# Patient Record
Sex: Female | Born: 1950 | Smoking: Never smoker
Health system: Southern US, Community
[De-identification: ages and names within clinical notes are randomized; demographics above are authoritative.]

## PROBLEM LIST (undated history)

## (undated) DIAGNOSIS — I10 Essential (primary) hypertension: Secondary | ICD-10-CM

## (undated) DIAGNOSIS — G8929 Other chronic pain: Secondary | ICD-10-CM

## (undated) DIAGNOSIS — M67911 Unspecified disorder of synovium and tendon, right shoulder: Secondary | ICD-10-CM

## (undated) DIAGNOSIS — K219 Gastro-esophageal reflux disease without esophagitis: Secondary | ICD-10-CM

## (undated) DIAGNOSIS — G2581 Restless legs syndrome: Secondary | ICD-10-CM

## (undated) HISTORY — PX: TONSILLECTOMY: SUR1361

## (undated) HISTORY — PX: COLONOSCOPY: SHX174

## (undated) HISTORY — PX: BACK SURGERY: SHX140

## (undated) HISTORY — PX: OVARIAN CYST SURGERY: SHX726

---

## 1998-07-19 ENCOUNTER — Other Ambulatory Visit: Admission: RE | Admit: 1998-07-19 | Discharge: 1998-07-19 | Payer: Self-pay | Admitting: *Deleted

## 2002-07-06 ENCOUNTER — Ambulatory Visit (HOSPITAL_COMMUNITY): Admission: RE | Admit: 2002-07-06 | Discharge: 2002-07-06 | Payer: Self-pay | Admitting: *Deleted

## 2002-07-06 ENCOUNTER — Encounter (INDEPENDENT_AMBULATORY_CARE_PROVIDER_SITE_OTHER): Payer: Self-pay | Admitting: Specialist

## 2003-10-19 ENCOUNTER — Other Ambulatory Visit: Admission: RE | Admit: 2003-10-19 | Discharge: 2003-10-19 | Payer: Self-pay | Admitting: *Deleted

## 2006-01-30 ENCOUNTER — Other Ambulatory Visit: Admission: RE | Admit: 2006-01-30 | Discharge: 2006-01-30 | Payer: Self-pay | Admitting: *Deleted

## 2007-02-05 ENCOUNTER — Other Ambulatory Visit: Admission: RE | Admit: 2007-02-05 | Discharge: 2007-02-05 | Payer: Self-pay | Admitting: *Deleted

## 2010-07-12 ENCOUNTER — Other Ambulatory Visit: Payer: Self-pay | Admitting: Gynecology

## 2010-07-12 DIAGNOSIS — R928 Other abnormal and inconclusive findings on diagnostic imaging of breast: Secondary | ICD-10-CM

## 2010-07-17 ENCOUNTER — Other Ambulatory Visit: Payer: Self-pay

## 2010-07-17 ENCOUNTER — Ambulatory Visit
Admission: RE | Admit: 2010-07-17 | Discharge: 2010-07-17 | Disposition: A | Discharge status: Home / Self Care | Payer: Self-pay | Source: Ambulatory Visit | Attending: Gynecology | Admitting: Gynecology

## 2010-07-17 DIAGNOSIS — R928 Other abnormal and inconclusive findings on diagnostic imaging of breast: Secondary | ICD-10-CM

## 2010-09-29 NOTE — Op Note (Signed)
NAMELAEL, WETHERBEE                           ACCOUNT NO.:  [id number]   MEDICAL RECORD NO.:  [id number]                   PATIENT TYPE:  AMB   LOCATION:  DAY                                  FACILITY:  [facility]   PHYSICIAN:  Almedia Balls. Fore, M.D.                DATE OF BIRTH:  August 22, 1950   DATE OF PROCEDURE:  07/06/2002  DATE OF DISCHARGE:                                 OPERATIVE REPORT   PREOPERATIVE DIAGNOSES:  1. Pelvic pain.  2. Persisting adnexal cysts.   POSTOPERATIVE DIAGNOSES:  1. Pelvic pain.  2. Persisting adnexal cysts, pending pathology.   OPERATION:  1. Laparoscopy with excision of uterine lesions.  2. Destruction of the uterine lesions.   ANESTHESIA:  General orotracheal.   SURGEON:  Almedia Balls. Randell Patient, M.D.   INDICATIONS FOR PROCEDURE:  The patient is a 60 year old with pelvic pain,  uterine enlargement, persisting adnexal cystic areas, for a laparoscopy.  She has been fully counseled as to the nature of the procedure and the risks  involved, including the risks of anesthesia, injury to the uterus, tubes,  ovaries, bowel, bladder, blood vessels and ureters, postoperative  hemorrhage, infection and recuperation.  She fully understands all of these  considerations and wishes to proceed on July 06, 2002.   INDICATIONS FOR OUTPATIENT PROCEDURE:  This procedure is commonly performed  as an outpatient procedure.   FINDINGS:  On laparoscopy, the uterus was enlarged to approximately 8-[redacted]  weeks gestational size.  This also appeared to be soft.  The right ovary  appeared to be normal.  There was a corpus luteum present on the left ovary.  There were cystic lesions in the right adnexal area which extended down the  posterior surface of the uterus, over to the left adnexal area as well.  The  lower liver edge, gallbladder, spleen and appendix were normal to  visualization.   DESCRIPTION OF PROCEDURE:  With the patient under general anesthesia,  prepared and draped  in the usual sterile fashion, a tenaculum was placed on  the cervix with an acorn cannula.  The patient was then catheterized with  free flow of clear urine.  The patient was then prepared for a laparoscopic  procedure.  An incision was made in the lower pole of the umbilicus with  insertion of the Veress cannula and insufflation of 3 L of carbon dioxide.  The disposable 10 mm trocar for the operative scope, and the scope itself  was inserted into the peritoneal cavity.  The disposable 5 mm probe was  inserted through a stab wound just above the symphysis pubis.  The above-  noted findings were visualized.  The cystic lesions on the area of the  uterus were excised using sharp dissection.  Bipolar electrocoagulation was  used for hemostasis of this area, and for the destruction of other small  cystic lesions on the posterior serosal surface of  the uterus.  After noting  that hemostasis was maintained, following the reduction of intra-abdominal  pressure, by allowing gas to escape, and that the sponge and instrument  counts were correct, finally the instruments were removed from the  peritoneal cavity.  Gas was allowed to fully escape.  The incisions were  closed with fascial sutures of #0 Vicryl and subcuticular suture of #3-0  plain catgut.  The estimated blood loss was less than 25 mL.   The patient was taken to the recovery room in good condition following  catheterization and free flow of clear urine.   FOLLOW UP:  She is to return to the office in two weeks for followup.  She  was advised to use heat and Neosporin on the incisions.  She will be  discharged from the recovery room when her vital signs are stable, to be  fully ambulatory and on a regular diet.  She has been in good condition  throughout the procedure and will be discharged if she continues this way.  She was given a prescription for Darvocet-N 100 or generic, #15, to be taken  1/2 to 2 q.4h. p.r.n. pain, and Macrobid  #8, to be taken two stat and one  b.i.d.                                                Almedia Balls. Randell Patient, M.D.    SRF/MEDQ  D:  07/06/2002  T:  07/06/2002  Job:  [id number]

## 2011-10-17 ENCOUNTER — Other Ambulatory Visit: Payer: Self-pay | Admitting: Gynecology

## 2011-10-17 DIAGNOSIS — R928 Other abnormal and inconclusive findings on diagnostic imaging of breast: Secondary | ICD-10-CM

## 2011-10-24 ENCOUNTER — Ambulatory Visit
Admission: RE | Admit: 2011-10-24 | Discharge: 2011-10-24 | Disposition: A | Discharge status: Home / Self Care | Payer: Self-pay | Source: Ambulatory Visit | Attending: Gynecology | Admitting: Gynecology

## 2011-10-24 DIAGNOSIS — R928 Other abnormal and inconclusive findings on diagnostic imaging of breast: Secondary | ICD-10-CM

## 2014-11-09 ENCOUNTER — Other Ambulatory Visit: Payer: Self-pay | Admitting: Gynecology

## 2014-11-10 LAB — CYTOLOGY - PAP

## 2015-02-15 DIAGNOSIS — Z6832 Body mass index (BMI) 32.0-32.9, adult: Secondary | ICD-10-CM | POA: Diagnosis not present

## 2015-02-15 DIAGNOSIS — E785 Hyperlipidemia, unspecified: Secondary | ICD-10-CM | POA: Diagnosis not present

## 2015-02-15 DIAGNOSIS — Z9181 History of falling: Secondary | ICD-10-CM | POA: Diagnosis not present

## 2015-02-15 DIAGNOSIS — I10 Essential (primary) hypertension: Secondary | ICD-10-CM | POA: Diagnosis not present

## 2015-02-15 DIAGNOSIS — Z23 Encounter for immunization: Secondary | ICD-10-CM | POA: Diagnosis not present

## 2015-02-15 DIAGNOSIS — Z1389 Encounter for screening for other disorder: Secondary | ICD-10-CM | POA: Diagnosis not present

## 2015-03-09 DIAGNOSIS — Z0000 Encounter for general adult medical examination without abnormal findings: Secondary | ICD-10-CM | POA: Diagnosis not present

## 2015-03-09 DIAGNOSIS — Z6832 Body mass index (BMI) 32.0-32.9, adult: Secondary | ICD-10-CM | POA: Diagnosis not present

## 2015-03-09 DIAGNOSIS — R5381 Other malaise: Secondary | ICD-10-CM | POA: Diagnosis not present

## 2015-03-09 DIAGNOSIS — Z23 Encounter for immunization: Secondary | ICD-10-CM | POA: Diagnosis not present

## 2015-03-09 DIAGNOSIS — E785 Hyperlipidemia, unspecified: Secondary | ICD-10-CM | POA: Diagnosis not present

## 2015-03-21 DIAGNOSIS — M8588 Other specified disorders of bone density and structure, other site: Secondary | ICD-10-CM | POA: Diagnosis not present

## 2015-03-21 DIAGNOSIS — N958 Other specified menopausal and perimenopausal disorders: Secondary | ICD-10-CM | POA: Diagnosis not present

## 2015-04-11 DIAGNOSIS — I10 Essential (primary) hypertension: Secondary | ICD-10-CM | POA: Diagnosis not present

## 2015-04-11 DIAGNOSIS — Z6832 Body mass index (BMI) 32.0-32.9, adult: Secondary | ICD-10-CM | POA: Diagnosis not present

## 2015-04-11 DIAGNOSIS — E663 Overweight: Secondary | ICD-10-CM | POA: Diagnosis not present

## 2015-04-18 DIAGNOSIS — Z1211 Encounter for screening for malignant neoplasm of colon: Secondary | ICD-10-CM | POA: Diagnosis not present

## 2015-05-17 DIAGNOSIS — M5412 Radiculopathy, cervical region: Secondary | ICD-10-CM | POA: Diagnosis not present

## 2015-05-17 DIAGNOSIS — M542 Cervicalgia: Secondary | ICD-10-CM | POA: Diagnosis not present

## 2015-05-17 DIAGNOSIS — Z6831 Body mass index (BMI) 31.0-31.9, adult: Secondary | ICD-10-CM | POA: Diagnosis not present

## 2015-05-18 DIAGNOSIS — M4802 Spinal stenosis, cervical region: Secondary | ICD-10-CM | POA: Diagnosis not present

## 2015-05-18 DIAGNOSIS — M79601 Pain in right arm: Secondary | ICD-10-CM | POA: Diagnosis not present

## 2015-05-18 DIAGNOSIS — M50323 Other cervical disc degeneration at C6-C7 level: Secondary | ICD-10-CM | POA: Diagnosis not present

## 2015-05-18 DIAGNOSIS — M47892 Other spondylosis, cervical region: Secondary | ICD-10-CM | POA: Diagnosis not present

## 2015-05-18 DIAGNOSIS — M79602 Pain in left arm: Secondary | ICD-10-CM | POA: Diagnosis not present

## 2015-05-18 DIAGNOSIS — M5030 Other cervical disc degeneration, unspecified cervical region: Secondary | ICD-10-CM | POA: Diagnosis not present

## 2015-05-25 DIAGNOSIS — M542 Cervicalgia: Secondary | ICD-10-CM | POA: Diagnosis not present

## 2015-05-30 DIAGNOSIS — M542 Cervicalgia: Secondary | ICD-10-CM | POA: Diagnosis not present

## 2015-06-01 DIAGNOSIS — M542 Cervicalgia: Secondary | ICD-10-CM | POA: Diagnosis not present

## 2015-06-02 DIAGNOSIS — M542 Cervicalgia: Secondary | ICD-10-CM | POA: Diagnosis not present

## 2015-06-07 DIAGNOSIS — M542 Cervicalgia: Secondary | ICD-10-CM | POA: Diagnosis not present

## 2015-06-14 DIAGNOSIS — M542 Cervicalgia: Secondary | ICD-10-CM | POA: Diagnosis not present

## 2015-06-16 DIAGNOSIS — M542 Cervicalgia: Secondary | ICD-10-CM | POA: Diagnosis not present

## 2015-07-14 DIAGNOSIS — M8580 Other specified disorders of bone density and structure, unspecified site: Secondary | ICD-10-CM | POA: Diagnosis not present

## 2015-07-14 DIAGNOSIS — I10 Essential (primary) hypertension: Secondary | ICD-10-CM | POA: Diagnosis not present

## 2015-09-19 DIAGNOSIS — Z6830 Body mass index (BMI) 30.0-30.9, adult: Secondary | ICD-10-CM | POA: Diagnosis not present

## 2015-09-19 DIAGNOSIS — R1013 Epigastric pain: Secondary | ICD-10-CM | POA: Diagnosis not present

## 2015-10-17 DIAGNOSIS — I10 Essential (primary) hypertension: Secondary | ICD-10-CM | POA: Diagnosis not present

## 2015-10-17 DIAGNOSIS — E785 Hyperlipidemia, unspecified: Secondary | ICD-10-CM | POA: Diagnosis not present

## 2015-10-17 DIAGNOSIS — E559 Vitamin D deficiency, unspecified: Secondary | ICD-10-CM | POA: Diagnosis not present

## 2015-10-17 DIAGNOSIS — M1990 Unspecified osteoarthritis, unspecified site: Secondary | ICD-10-CM | POA: Diagnosis not present

## 2015-10-17 DIAGNOSIS — M79641 Pain in right hand: Secondary | ICD-10-CM | POA: Diagnosis not present

## 2015-10-17 DIAGNOSIS — Z6830 Body mass index (BMI) 30.0-30.9, adult: Secondary | ICD-10-CM | POA: Diagnosis not present

## 2015-10-17 DIAGNOSIS — Z1231 Encounter for screening mammogram for malignant neoplasm of breast: Secondary | ICD-10-CM | POA: Diagnosis not present

## 2015-10-27 DIAGNOSIS — Z1231 Encounter for screening mammogram for malignant neoplasm of breast: Secondary | ICD-10-CM | POA: Diagnosis not present

## 2015-12-01 DIAGNOSIS — M5412 Radiculopathy, cervical region: Secondary | ICD-10-CM | POA: Diagnosis not present

## 2015-12-01 DIAGNOSIS — Z6829 Body mass index (BMI) 29.0-29.9, adult: Secondary | ICD-10-CM | POA: Diagnosis not present

## 2015-12-07 DIAGNOSIS — M5412 Radiculopathy, cervical region: Secondary | ICD-10-CM | POA: Diagnosis not present

## 2015-12-07 DIAGNOSIS — M50221 Other cervical disc displacement at C4-C5 level: Secondary | ICD-10-CM | POA: Diagnosis not present

## 2015-12-07 DIAGNOSIS — M50323 Other cervical disc degeneration at C6-C7 level: Secondary | ICD-10-CM | POA: Diagnosis not present

## 2015-12-07 DIAGNOSIS — M50322 Other cervical disc degeneration at C5-C6 level: Secondary | ICD-10-CM | POA: Diagnosis not present

## 2015-12-26 DIAGNOSIS — M50222 Other cervical disc displacement at C5-C6 level: Secondary | ICD-10-CM | POA: Diagnosis not present

## 2015-12-26 DIAGNOSIS — M542 Cervicalgia: Secondary | ICD-10-CM | POA: Diagnosis not present

## 2015-12-26 DIAGNOSIS — M4802 Spinal stenosis, cervical region: Secondary | ICD-10-CM | POA: Diagnosis not present

## 2016-01-19 DIAGNOSIS — E785 Hyperlipidemia, unspecified: Secondary | ICD-10-CM | POA: Diagnosis not present

## 2016-01-19 DIAGNOSIS — M5412 Radiculopathy, cervical region: Secondary | ICD-10-CM | POA: Diagnosis not present

## 2016-01-19 DIAGNOSIS — M1990 Unspecified osteoarthritis, unspecified site: Secondary | ICD-10-CM | POA: Diagnosis not present

## 2016-01-19 DIAGNOSIS — R1013 Epigastric pain: Secondary | ICD-10-CM | POA: Diagnosis not present

## 2016-01-19 DIAGNOSIS — I10 Essential (primary) hypertension: Secondary | ICD-10-CM | POA: Diagnosis not present

## 2016-02-06 DIAGNOSIS — M549 Dorsalgia, unspecified: Secondary | ICD-10-CM | POA: Diagnosis not present

## 2016-02-13 DIAGNOSIS — H1013 Acute atopic conjunctivitis, bilateral: Secondary | ICD-10-CM | POA: Diagnosis not present

## 2016-02-20 DIAGNOSIS — R269 Unspecified abnormalities of gait and mobility: Secondary | ICD-10-CM | POA: Diagnosis not present

## 2016-02-20 DIAGNOSIS — M545 Low back pain: Secondary | ICD-10-CM | POA: Diagnosis not present

## 2016-02-20 DIAGNOSIS — M79604 Pain in right leg: Secondary | ICD-10-CM | POA: Diagnosis not present

## 2016-02-20 DIAGNOSIS — M2560 Stiffness of unspecified joint, not elsewhere classified: Secondary | ICD-10-CM | POA: Diagnosis not present

## 2016-02-23 DIAGNOSIS — M79604 Pain in right leg: Secondary | ICD-10-CM | POA: Diagnosis not present

## 2016-02-23 DIAGNOSIS — M545 Low back pain: Secondary | ICD-10-CM | POA: Diagnosis not present

## 2016-02-23 DIAGNOSIS — M2560 Stiffness of unspecified joint, not elsewhere classified: Secondary | ICD-10-CM | POA: Diagnosis not present

## 2016-02-23 DIAGNOSIS — R269 Unspecified abnormalities of gait and mobility: Secondary | ICD-10-CM | POA: Diagnosis not present

## 2016-02-28 DIAGNOSIS — M79604 Pain in right leg: Secondary | ICD-10-CM | POA: Diagnosis not present

## 2016-02-28 DIAGNOSIS — R269 Unspecified abnormalities of gait and mobility: Secondary | ICD-10-CM | POA: Diagnosis not present

## 2016-02-28 DIAGNOSIS — M2560 Stiffness of unspecified joint, not elsewhere classified: Secondary | ICD-10-CM | POA: Diagnosis not present

## 2016-02-28 DIAGNOSIS — M545 Low back pain: Secondary | ICD-10-CM | POA: Diagnosis not present

## 2016-03-01 DIAGNOSIS — R269 Unspecified abnormalities of gait and mobility: Secondary | ICD-10-CM | POA: Diagnosis not present

## 2016-03-01 DIAGNOSIS — M79604 Pain in right leg: Secondary | ICD-10-CM | POA: Diagnosis not present

## 2016-03-01 DIAGNOSIS — M545 Low back pain: Secondary | ICD-10-CM | POA: Diagnosis not present

## 2016-03-01 DIAGNOSIS — M2560 Stiffness of unspecified joint, not elsewhere classified: Secondary | ICD-10-CM | POA: Diagnosis not present

## 2016-03-06 DIAGNOSIS — M79604 Pain in right leg: Secondary | ICD-10-CM | POA: Diagnosis not present

## 2016-03-06 DIAGNOSIS — M545 Low back pain: Secondary | ICD-10-CM | POA: Diagnosis not present

## 2016-03-06 DIAGNOSIS — R269 Unspecified abnormalities of gait and mobility: Secondary | ICD-10-CM | POA: Diagnosis not present

## 2016-03-06 DIAGNOSIS — M2560 Stiffness of unspecified joint, not elsewhere classified: Secondary | ICD-10-CM | POA: Diagnosis not present

## 2016-03-13 DIAGNOSIS — M79604 Pain in right leg: Secondary | ICD-10-CM | POA: Diagnosis not present

## 2016-03-13 DIAGNOSIS — M2560 Stiffness of unspecified joint, not elsewhere classified: Secondary | ICD-10-CM | POA: Diagnosis not present

## 2016-03-13 DIAGNOSIS — R269 Unspecified abnormalities of gait and mobility: Secondary | ICD-10-CM | POA: Diagnosis not present

## 2016-03-13 DIAGNOSIS — M545 Low back pain: Secondary | ICD-10-CM | POA: Diagnosis not present

## 2016-03-15 DIAGNOSIS — R269 Unspecified abnormalities of gait and mobility: Secondary | ICD-10-CM | POA: Diagnosis not present

## 2016-03-15 DIAGNOSIS — M79604 Pain in right leg: Secondary | ICD-10-CM | POA: Diagnosis not present

## 2016-03-15 DIAGNOSIS — M545 Low back pain: Secondary | ICD-10-CM | POA: Diagnosis not present

## 2016-03-15 DIAGNOSIS — M2560 Stiffness of unspecified joint, not elsewhere classified: Secondary | ICD-10-CM | POA: Diagnosis not present

## 2016-03-21 DIAGNOSIS — L821 Other seborrheic keratosis: Secondary | ICD-10-CM | POA: Diagnosis not present

## 2016-03-21 DIAGNOSIS — L219 Seborrheic dermatitis, unspecified: Secondary | ICD-10-CM | POA: Diagnosis not present

## 2016-03-21 DIAGNOSIS — L578 Other skin changes due to chronic exposure to nonionizing radiation: Secondary | ICD-10-CM | POA: Diagnosis not present

## 2016-04-03 DIAGNOSIS — M50222 Other cervical disc displacement at C5-C6 level: Secondary | ICD-10-CM | POA: Diagnosis not present

## 2016-04-19 DIAGNOSIS — E785 Hyperlipidemia, unspecified: Secondary | ICD-10-CM | POA: Diagnosis not present

## 2016-04-19 DIAGNOSIS — Z23 Encounter for immunization: Secondary | ICD-10-CM | POA: Diagnosis not present

## 2016-04-19 DIAGNOSIS — M5412 Radiculopathy, cervical region: Secondary | ICD-10-CM | POA: Diagnosis not present

## 2016-04-19 DIAGNOSIS — Z1389 Encounter for screening for other disorder: Secondary | ICD-10-CM | POA: Diagnosis not present

## 2016-04-19 DIAGNOSIS — I10 Essential (primary) hypertension: Secondary | ICD-10-CM | POA: Diagnosis not present

## 2016-04-19 DIAGNOSIS — M1990 Unspecified osteoarthritis, unspecified site: Secondary | ICD-10-CM | POA: Diagnosis not present

## 2016-04-19 DIAGNOSIS — Z9181 History of falling: Secondary | ICD-10-CM | POA: Diagnosis not present

## 2016-05-01 DIAGNOSIS — M50222 Other cervical disc displacement at C5-C6 level: Secondary | ICD-10-CM | POA: Diagnosis not present

## 2016-05-01 DIAGNOSIS — M4802 Spinal stenosis, cervical region: Secondary | ICD-10-CM | POA: Diagnosis not present

## 2016-05-02 DIAGNOSIS — L821 Other seborrheic keratosis: Secondary | ICD-10-CM | POA: Diagnosis not present

## 2016-05-02 DIAGNOSIS — L219 Seborrheic dermatitis, unspecified: Secondary | ICD-10-CM | POA: Diagnosis not present

## 2016-05-14 DIAGNOSIS — M5430 Sciatica, unspecified side: Secondary | ICD-10-CM | POA: Insufficient documentation

## 2016-05-17 DIAGNOSIS — J208 Acute bronchitis due to other specified organisms: Secondary | ICD-10-CM | POA: Diagnosis not present

## 2016-05-17 DIAGNOSIS — R509 Fever, unspecified: Secondary | ICD-10-CM | POA: Diagnosis not present

## 2016-05-29 DIAGNOSIS — M4802 Spinal stenosis, cervical region: Secondary | ICD-10-CM | POA: Diagnosis not present

## 2016-06-04 DIAGNOSIS — M545 Low back pain: Secondary | ICD-10-CM | POA: Diagnosis not present

## 2016-06-04 DIAGNOSIS — M5431 Sciatica, right side: Secondary | ICD-10-CM | POA: Diagnosis not present

## 2016-06-04 DIAGNOSIS — M5136 Other intervertebral disc degeneration, lumbar region: Secondary | ICD-10-CM | POA: Diagnosis not present

## 2016-06-11 DIAGNOSIS — M5126 Other intervertebral disc displacement, lumbar region: Secondary | ICD-10-CM | POA: Diagnosis not present

## 2016-06-11 DIAGNOSIS — M48061 Spinal stenosis, lumbar region without neurogenic claudication: Secondary | ICD-10-CM | POA: Diagnosis not present

## 2016-06-11 DIAGNOSIS — M545 Low back pain: Secondary | ICD-10-CM | POA: Diagnosis not present

## 2016-06-11 DIAGNOSIS — M5431 Sciatica, right side: Secondary | ICD-10-CM | POA: Diagnosis not present

## 2016-07-03 DIAGNOSIS — Z1389 Encounter for screening for other disorder: Secondary | ICD-10-CM | POA: Diagnosis not present

## 2016-07-03 DIAGNOSIS — Z136 Encounter for screening for cardiovascular disorders: Secondary | ICD-10-CM | POA: Diagnosis not present

## 2016-07-03 DIAGNOSIS — Z23 Encounter for immunization: Secondary | ICD-10-CM | POA: Diagnosis not present

## 2016-07-03 DIAGNOSIS — Z9181 History of falling: Secondary | ICD-10-CM | POA: Diagnosis not present

## 2016-07-03 DIAGNOSIS — Z0000 Encounter for general adult medical examination without abnormal findings: Secondary | ICD-10-CM | POA: Diagnosis not present

## 2016-07-03 DIAGNOSIS — N959 Unspecified menopausal and perimenopausal disorder: Secondary | ICD-10-CM | POA: Diagnosis not present

## 2016-07-03 DIAGNOSIS — Z1231 Encounter for screening mammogram for malignant neoplasm of breast: Secondary | ICD-10-CM | POA: Diagnosis not present

## 2016-07-18 DIAGNOSIS — M1990 Unspecified osteoarthritis, unspecified site: Secondary | ICD-10-CM | POA: Diagnosis not present

## 2016-07-18 DIAGNOSIS — I10 Essential (primary) hypertension: Secondary | ICD-10-CM | POA: Diagnosis not present

## 2016-07-18 DIAGNOSIS — E785 Hyperlipidemia, unspecified: Secondary | ICD-10-CM | POA: Diagnosis not present

## 2016-07-18 DIAGNOSIS — E559 Vitamin D deficiency, unspecified: Secondary | ICD-10-CM | POA: Diagnosis not present

## 2016-07-18 DIAGNOSIS — R1013 Epigastric pain: Secondary | ICD-10-CM | POA: Diagnosis not present

## 2016-07-26 DIAGNOSIS — M5431 Sciatica, right side: Secondary | ICD-10-CM | POA: Diagnosis not present

## 2016-07-26 DIAGNOSIS — M48062 Spinal stenosis, lumbar region with neurogenic claudication: Secondary | ICD-10-CM | POA: Diagnosis not present

## 2016-07-26 DIAGNOSIS — M549 Dorsalgia, unspecified: Secondary | ICD-10-CM | POA: Diagnosis not present

## 2016-07-26 DIAGNOSIS — M5136 Other intervertebral disc degeneration, lumbar region: Secondary | ICD-10-CM | POA: Diagnosis not present

## 2016-07-26 DIAGNOSIS — M545 Low back pain: Secondary | ICD-10-CM | POA: Diagnosis not present

## 2016-08-17 DIAGNOSIS — Z4689 Encounter for fitting and adjustment of other specified devices: Secondary | ICD-10-CM | POA: Diagnosis not present

## 2016-08-17 DIAGNOSIS — M5431 Sciatica, right side: Secondary | ICD-10-CM | POA: Diagnosis not present

## 2016-08-17 DIAGNOSIS — M48062 Spinal stenosis, lumbar region with neurogenic claudication: Secondary | ICD-10-CM | POA: Diagnosis not present

## 2016-08-17 DIAGNOSIS — Z01812 Encounter for preprocedural laboratory examination: Secondary | ICD-10-CM | POA: Diagnosis not present

## 2016-08-17 DIAGNOSIS — M5106 Intervertebral disc disorders with myelopathy, lumbar region: Secondary | ICD-10-CM | POA: Diagnosis not present

## 2016-08-17 DIAGNOSIS — Z5189 Encounter for other specified aftercare: Secondary | ICD-10-CM | POA: Diagnosis not present

## 2016-08-17 DIAGNOSIS — M545 Low back pain: Secondary | ICD-10-CM | POA: Diagnosis not present

## 2016-08-21 DIAGNOSIS — M5106 Intervertebral disc disorders with myelopathy, lumbar region: Secondary | ICD-10-CM | POA: Diagnosis not present

## 2016-08-21 DIAGNOSIS — Z9889 Other specified postprocedural states: Secondary | ICD-10-CM | POA: Diagnosis not present

## 2016-08-21 DIAGNOSIS — M1990 Unspecified osteoarthritis, unspecified site: Secondary | ICD-10-CM | POA: Diagnosis not present

## 2016-08-21 DIAGNOSIS — Z79899 Other long term (current) drug therapy: Secondary | ICD-10-CM | POA: Diagnosis not present

## 2016-08-21 DIAGNOSIS — Z981 Arthrodesis status: Secondary | ICD-10-CM | POA: Diagnosis not present

## 2016-08-21 DIAGNOSIS — R2689 Other abnormalities of gait and mobility: Secondary | ICD-10-CM | POA: Diagnosis not present

## 2016-08-21 DIAGNOSIS — M48061 Spinal stenosis, lumbar region without neurogenic claudication: Secondary | ICD-10-CM | POA: Diagnosis not present

## 2016-08-21 DIAGNOSIS — M47816 Spondylosis without myelopathy or radiculopathy, lumbar region: Secondary | ICD-10-CM | POA: Diagnosis not present

## 2016-08-21 DIAGNOSIS — Z7982 Long term (current) use of aspirin: Secondary | ICD-10-CM | POA: Diagnosis not present

## 2016-08-21 DIAGNOSIS — M48062 Spinal stenosis, lumbar region with neurogenic claudication: Secondary | ICD-10-CM | POA: Diagnosis not present

## 2016-08-21 DIAGNOSIS — M5116 Intervertebral disc disorders with radiculopathy, lumbar region: Secondary | ICD-10-CM | POA: Diagnosis not present

## 2016-08-21 DIAGNOSIS — I10 Essential (primary) hypertension: Secondary | ICD-10-CM | POA: Diagnosis not present

## 2016-08-21 DIAGNOSIS — M5431 Sciatica, right side: Secondary | ICD-10-CM | POA: Diagnosis not present

## 2016-08-22 DIAGNOSIS — M5116 Intervertebral disc disorders with radiculopathy, lumbar region: Secondary | ICD-10-CM | POA: Diagnosis not present

## 2016-08-22 DIAGNOSIS — I10 Essential (primary) hypertension: Secondary | ICD-10-CM | POA: Diagnosis not present

## 2016-08-22 DIAGNOSIS — Z981 Arthrodesis status: Secondary | ICD-10-CM | POA: Diagnosis not present

## 2016-08-22 DIAGNOSIS — R2689 Other abnormalities of gait and mobility: Secondary | ICD-10-CM | POA: Diagnosis not present

## 2016-08-22 DIAGNOSIS — M1990 Unspecified osteoarthritis, unspecified site: Secondary | ICD-10-CM | POA: Diagnosis not present

## 2016-08-22 DIAGNOSIS — M47816 Spondylosis without myelopathy or radiculopathy, lumbar region: Secondary | ICD-10-CM | POA: Diagnosis not present

## 2016-08-22 DIAGNOSIS — M48061 Spinal stenosis, lumbar region without neurogenic claudication: Secondary | ICD-10-CM | POA: Diagnosis not present

## 2016-09-21 DIAGNOSIS — M48062 Spinal stenosis, lumbar region with neurogenic claudication: Secondary | ICD-10-CM | POA: Diagnosis not present

## 2016-11-12 DIAGNOSIS — Z1231 Encounter for screening mammogram for malignant neoplasm of breast: Secondary | ICD-10-CM | POA: Diagnosis not present

## 2016-11-12 DIAGNOSIS — M85851 Other specified disorders of bone density and structure, right thigh: Secondary | ICD-10-CM | POA: Diagnosis not present

## 2016-11-12 DIAGNOSIS — N959 Unspecified menopausal and perimenopausal disorder: Secondary | ICD-10-CM | POA: Diagnosis not present

## 2016-11-21 DIAGNOSIS — M48062 Spinal stenosis, lumbar region with neurogenic claudication: Secondary | ICD-10-CM | POA: Diagnosis not present

## 2016-11-26 DIAGNOSIS — R921 Mammographic calcification found on diagnostic imaging of breast: Secondary | ICD-10-CM | POA: Diagnosis not present

## 2016-11-28 DIAGNOSIS — R921 Mammographic calcification found on diagnostic imaging of breast: Secondary | ICD-10-CM | POA: Diagnosis not present

## 2016-11-29 DIAGNOSIS — D241 Benign neoplasm of right breast: Secondary | ICD-10-CM | POA: Diagnosis not present

## 2016-11-29 DIAGNOSIS — N6312 Unspecified lump in the right breast, upper inner quadrant: Secondary | ICD-10-CM | POA: Diagnosis not present

## 2016-11-29 DIAGNOSIS — N6031 Fibrosclerosis of right breast: Secondary | ICD-10-CM | POA: Diagnosis not present

## 2016-11-29 DIAGNOSIS — R921 Mammographic calcification found on diagnostic imaging of breast: Secondary | ICD-10-CM | POA: Diagnosis not present

## 2016-12-10 DIAGNOSIS — M1990 Unspecified osteoarthritis, unspecified site: Secondary | ICD-10-CM | POA: Diagnosis not present

## 2016-12-10 DIAGNOSIS — Z6830 Body mass index (BMI) 30.0-30.9, adult: Secondary | ICD-10-CM | POA: Diagnosis not present

## 2016-12-10 DIAGNOSIS — E559 Vitamin D deficiency, unspecified: Secondary | ICD-10-CM | POA: Diagnosis not present

## 2016-12-10 DIAGNOSIS — I10 Essential (primary) hypertension: Secondary | ICD-10-CM | POA: Diagnosis not present

## 2016-12-10 DIAGNOSIS — E785 Hyperlipidemia, unspecified: Secondary | ICD-10-CM | POA: Diagnosis not present

## 2016-12-10 DIAGNOSIS — M48062 Spinal stenosis, lumbar region with neurogenic claudication: Secondary | ICD-10-CM | POA: Diagnosis not present

## 2016-12-10 DIAGNOSIS — E669 Obesity, unspecified: Secondary | ICD-10-CM | POA: Diagnosis not present

## 2016-12-12 HISTORY — PX: BREAST BIOPSY: SHX20

## 2017-01-28 DIAGNOSIS — M545 Low back pain: Secondary | ICD-10-CM | POA: Diagnosis not present

## 2017-01-28 DIAGNOSIS — M5431 Sciatica, right side: Secondary | ICD-10-CM | POA: Diagnosis not present

## 2017-01-28 DIAGNOSIS — M48062 Spinal stenosis, lumbar region with neurogenic claudication: Secondary | ICD-10-CM | POA: Diagnosis not present

## 2017-01-28 DIAGNOSIS — M5136 Other intervertebral disc degeneration, lumbar region: Secondary | ICD-10-CM | POA: Diagnosis not present

## 2017-02-26 DIAGNOSIS — M5431 Sciatica, right side: Secondary | ICD-10-CM | POA: Diagnosis not present

## 2017-02-26 DIAGNOSIS — M5106 Intervertebral disc disorders with myelopathy, lumbar region: Secondary | ICD-10-CM | POA: Diagnosis not present

## 2017-02-26 DIAGNOSIS — M545 Low back pain: Secondary | ICD-10-CM | POA: Diagnosis not present

## 2017-02-26 DIAGNOSIS — M48062 Spinal stenosis, lumbar region with neurogenic claudication: Secondary | ICD-10-CM | POA: Diagnosis not present

## 2017-03-06 DIAGNOSIS — M545 Low back pain: Secondary | ICD-10-CM | POA: Diagnosis not present

## 2017-03-06 DIAGNOSIS — M5416 Radiculopathy, lumbar region: Secondary | ICD-10-CM | POA: Diagnosis not present

## 2017-03-06 DIAGNOSIS — M6281 Muscle weakness (generalized): Secondary | ICD-10-CM | POA: Diagnosis not present

## 2017-03-11 DIAGNOSIS — L570 Actinic keratosis: Secondary | ICD-10-CM | POA: Diagnosis not present

## 2017-03-11 DIAGNOSIS — M545 Low back pain: Secondary | ICD-10-CM | POA: Diagnosis not present

## 2017-03-11 DIAGNOSIS — M5416 Radiculopathy, lumbar region: Secondary | ICD-10-CM | POA: Diagnosis not present

## 2017-03-11 DIAGNOSIS — L578 Other skin changes due to chronic exposure to nonionizing radiation: Secondary | ICD-10-CM | POA: Diagnosis not present

## 2017-03-11 DIAGNOSIS — M6281 Muscle weakness (generalized): Secondary | ICD-10-CM | POA: Diagnosis not present

## 2017-03-11 DIAGNOSIS — L821 Other seborrheic keratosis: Secondary | ICD-10-CM | POA: Diagnosis not present

## 2017-03-20 DIAGNOSIS — Z23 Encounter for immunization: Secondary | ICD-10-CM | POA: Diagnosis not present

## 2017-03-20 DIAGNOSIS — M5416 Radiculopathy, lumbar region: Secondary | ICD-10-CM | POA: Diagnosis not present

## 2017-03-20 DIAGNOSIS — M6281 Muscle weakness (generalized): Secondary | ICD-10-CM | POA: Diagnosis not present

## 2017-03-20 DIAGNOSIS — M545 Low back pain: Secondary | ICD-10-CM | POA: Diagnosis not present

## 2017-03-27 DIAGNOSIS — M5431 Sciatica, right side: Secondary | ICD-10-CM | POA: Diagnosis not present

## 2017-03-27 DIAGNOSIS — M545 Low back pain: Secondary | ICD-10-CM | POA: Diagnosis not present

## 2017-03-27 DIAGNOSIS — Z6829 Body mass index (BMI) 29.0-29.9, adult: Secondary | ICD-10-CM | POA: Diagnosis not present

## 2017-04-11 DIAGNOSIS — M48062 Spinal stenosis, lumbar region with neurogenic claudication: Secondary | ICD-10-CM | POA: Diagnosis not present

## 2017-04-11 DIAGNOSIS — M5136 Other intervertebral disc degeneration, lumbar region: Secondary | ICD-10-CM | POA: Diagnosis not present

## 2017-04-11 DIAGNOSIS — M5431 Sciatica, right side: Secondary | ICD-10-CM | POA: Diagnosis not present

## 2017-04-15 DIAGNOSIS — M1990 Unspecified osteoarthritis, unspecified site: Secondary | ICD-10-CM | POA: Diagnosis not present

## 2017-04-15 DIAGNOSIS — I10 Essential (primary) hypertension: Secondary | ICD-10-CM | POA: Diagnosis not present

## 2017-04-15 DIAGNOSIS — E785 Hyperlipidemia, unspecified: Secondary | ICD-10-CM | POA: Diagnosis not present

## 2017-04-15 DIAGNOSIS — E559 Vitamin D deficiency, unspecified: Secondary | ICD-10-CM | POA: Diagnosis not present

## 2017-04-15 DIAGNOSIS — Z6831 Body mass index (BMI) 31.0-31.9, adult: Secondary | ICD-10-CM | POA: Diagnosis not present

## 2017-04-29 DIAGNOSIS — M5431 Sciatica, right side: Secondary | ICD-10-CM | POA: Diagnosis not present

## 2017-05-23 DIAGNOSIS — L219 Seborrheic dermatitis, unspecified: Secondary | ICD-10-CM | POA: Diagnosis not present

## 2017-05-23 DIAGNOSIS — D045 Carcinoma in situ of skin of trunk: Secondary | ICD-10-CM | POA: Diagnosis not present

## 2017-05-23 DIAGNOSIS — L300 Nummular dermatitis: Secondary | ICD-10-CM | POA: Diagnosis not present

## 2017-05-23 DIAGNOSIS — L304 Erythema intertrigo: Secondary | ICD-10-CM | POA: Diagnosis not present

## 2017-05-23 DIAGNOSIS — L570 Actinic keratosis: Secondary | ICD-10-CM | POA: Diagnosis not present

## 2017-05-28 DIAGNOSIS — M5431 Sciatica, right side: Secondary | ICD-10-CM | POA: Diagnosis not present

## 2017-06-13 DIAGNOSIS — L820 Inflamed seborrheic keratosis: Secondary | ICD-10-CM | POA: Diagnosis not present

## 2017-06-13 DIAGNOSIS — L821 Other seborrheic keratosis: Secondary | ICD-10-CM | POA: Diagnosis not present

## 2017-06-13 DIAGNOSIS — L570 Actinic keratosis: Secondary | ICD-10-CM | POA: Diagnosis not present

## 2017-06-19 DIAGNOSIS — M47816 Spondylosis without myelopathy or radiculopathy, lumbar region: Secondary | ICD-10-CM | POA: Diagnosis not present

## 2017-06-26 DIAGNOSIS — M47816 Spondylosis without myelopathy or radiculopathy, lumbar region: Secondary | ICD-10-CM | POA: Diagnosis not present

## 2017-07-02 DIAGNOSIS — Z0000 Encounter for general adult medical examination without abnormal findings: Secondary | ICD-10-CM | POA: Diagnosis not present

## 2017-07-02 DIAGNOSIS — Z6831 Body mass index (BMI) 31.0-31.9, adult: Secondary | ICD-10-CM | POA: Diagnosis not present

## 2017-07-02 DIAGNOSIS — E669 Obesity, unspecified: Secondary | ICD-10-CM | POA: Diagnosis not present

## 2017-07-02 DIAGNOSIS — Z136 Encounter for screening for cardiovascular disorders: Secondary | ICD-10-CM | POA: Diagnosis not present

## 2017-07-02 DIAGNOSIS — E785 Hyperlipidemia, unspecified: Secondary | ICD-10-CM | POA: Diagnosis not present

## 2017-07-02 DIAGNOSIS — Z1231 Encounter for screening mammogram for malignant neoplasm of breast: Secondary | ICD-10-CM | POA: Diagnosis not present

## 2017-07-02 DIAGNOSIS — Z1331 Encounter for screening for depression: Secondary | ICD-10-CM | POA: Diagnosis not present

## 2017-07-02 DIAGNOSIS — Z9181 History of falling: Secondary | ICD-10-CM | POA: Diagnosis not present

## 2017-07-15 DIAGNOSIS — M47816 Spondylosis without myelopathy or radiculopathy, lumbar region: Secondary | ICD-10-CM | POA: Diagnosis not present

## 2017-08-01 DIAGNOSIS — Z6832 Body mass index (BMI) 32.0-32.9, adult: Secondary | ICD-10-CM | POA: Diagnosis not present

## 2017-08-01 DIAGNOSIS — Z01419 Encounter for gynecological examination (general) (routine) without abnormal findings: Secondary | ICD-10-CM | POA: Diagnosis not present

## 2017-08-12 DIAGNOSIS — M5431 Sciatica, right side: Secondary | ICD-10-CM | POA: Diagnosis not present

## 2017-08-12 DIAGNOSIS — Z6841 Body Mass Index (BMI) 40.0 and over, adult: Secondary | ICD-10-CM | POA: Diagnosis not present

## 2017-08-12 DIAGNOSIS — M5106 Intervertebral disc disorders with myelopathy, lumbar region: Secondary | ICD-10-CM | POA: Diagnosis not present

## 2017-08-12 DIAGNOSIS — M542 Cervicalgia: Secondary | ICD-10-CM | POA: Diagnosis not present

## 2017-08-19 DIAGNOSIS — E785 Hyperlipidemia, unspecified: Secondary | ICD-10-CM | POA: Diagnosis not present

## 2017-08-19 DIAGNOSIS — M5106 Intervertebral disc disorders with myelopathy, lumbar region: Secondary | ICD-10-CM | POA: Diagnosis not present

## 2017-08-19 DIAGNOSIS — I10 Essential (primary) hypertension: Secondary | ICD-10-CM | POA: Diagnosis not present

## 2017-08-19 DIAGNOSIS — M1990 Unspecified osteoarthritis, unspecified site: Secondary | ICD-10-CM | POA: Diagnosis not present

## 2017-08-19 DIAGNOSIS — Z6832 Body mass index (BMI) 32.0-32.9, adult: Secondary | ICD-10-CM | POA: Diagnosis not present

## 2017-08-19 DIAGNOSIS — M8580 Other specified disorders of bone density and structure, unspecified site: Secondary | ICD-10-CM | POA: Diagnosis not present

## 2017-08-19 DIAGNOSIS — E559 Vitamin D deficiency, unspecified: Secondary | ICD-10-CM | POA: Diagnosis not present

## 2017-08-19 DIAGNOSIS — M5412 Radiculopathy, cervical region: Secondary | ICD-10-CM | POA: Diagnosis not present

## 2017-08-19 DIAGNOSIS — M542 Cervicalgia: Secondary | ICD-10-CM | POA: Diagnosis not present

## 2017-08-19 DIAGNOSIS — E669 Obesity, unspecified: Secondary | ICD-10-CM | POA: Diagnosis not present

## 2017-09-12 DIAGNOSIS — M5106 Intervertebral disc disorders with myelopathy, lumbar region: Secondary | ICD-10-CM | POA: Diagnosis not present

## 2017-09-12 DIAGNOSIS — M5412 Radiculopathy, cervical region: Secondary | ICD-10-CM | POA: Diagnosis not present

## 2017-09-17 DIAGNOSIS — N83202 Unspecified ovarian cyst, left side: Secondary | ICD-10-CM | POA: Diagnosis not present

## 2017-10-02 DIAGNOSIS — M5431 Sciatica, right side: Secondary | ICD-10-CM | POA: Diagnosis not present

## 2017-10-31 DIAGNOSIS — M5136 Other intervertebral disc degeneration, lumbar region: Secondary | ICD-10-CM | POA: Diagnosis not present

## 2017-10-31 DIAGNOSIS — M545 Low back pain: Secondary | ICD-10-CM | POA: Diagnosis not present

## 2017-10-31 DIAGNOSIS — M5416 Radiculopathy, lumbar region: Secondary | ICD-10-CM | POA: Diagnosis not present

## 2017-11-11 DIAGNOSIS — M545 Low back pain: Secondary | ICD-10-CM | POA: Diagnosis not present

## 2017-12-02 DIAGNOSIS — Z1231 Encounter for screening mammogram for malignant neoplasm of breast: Secondary | ICD-10-CM | POA: Diagnosis not present

## 2017-12-11 DIAGNOSIS — M545 Low back pain: Secondary | ICD-10-CM | POA: Diagnosis not present

## 2017-12-19 DIAGNOSIS — Z79899 Other long term (current) drug therapy: Secondary | ICD-10-CM | POA: Diagnosis not present

## 2017-12-19 DIAGNOSIS — E669 Obesity, unspecified: Secondary | ICD-10-CM | POA: Diagnosis not present

## 2017-12-19 DIAGNOSIS — I10 Essential (primary) hypertension: Secondary | ICD-10-CM | POA: Diagnosis not present

## 2017-12-19 DIAGNOSIS — M1990 Unspecified osteoarthritis, unspecified site: Secondary | ICD-10-CM | POA: Diagnosis not present

## 2017-12-19 DIAGNOSIS — E785 Hyperlipidemia, unspecified: Secondary | ICD-10-CM | POA: Diagnosis not present

## 2017-12-19 DIAGNOSIS — M48062 Spinal stenosis, lumbar region with neurogenic claudication: Secondary | ICD-10-CM | POA: Diagnosis not present

## 2017-12-19 DIAGNOSIS — R945 Abnormal results of liver function studies: Secondary | ICD-10-CM | POA: Diagnosis not present

## 2017-12-23 DIAGNOSIS — M5416 Radiculopathy, lumbar region: Secondary | ICD-10-CM | POA: Diagnosis not present

## 2017-12-23 DIAGNOSIS — M5106 Intervertebral disc disorders with myelopathy, lumbar region: Secondary | ICD-10-CM | POA: Diagnosis not present

## 2017-12-23 DIAGNOSIS — M545 Low back pain: Secondary | ICD-10-CM | POA: Diagnosis not present

## 2017-12-24 DIAGNOSIS — R945 Abnormal results of liver function studies: Secondary | ICD-10-CM | POA: Diagnosis not present

## 2017-12-24 DIAGNOSIS — K760 Fatty (change of) liver, not elsewhere classified: Secondary | ICD-10-CM | POA: Diagnosis not present

## 2017-12-26 DIAGNOSIS — Z01818 Encounter for other preprocedural examination: Secondary | ICD-10-CM | POA: Diagnosis not present

## 2017-12-26 DIAGNOSIS — Z01810 Encounter for preprocedural cardiovascular examination: Secondary | ICD-10-CM | POA: Diagnosis not present

## 2017-12-26 DIAGNOSIS — M4316 Spondylolisthesis, lumbar region: Secondary | ICD-10-CM | POA: Diagnosis not present

## 2017-12-30 DIAGNOSIS — M5116 Intervertebral disc disorders with radiculopathy, lumbar region: Secondary | ICD-10-CM | POA: Diagnosis present

## 2017-12-30 DIAGNOSIS — I10 Essential (primary) hypertension: Secondary | ICD-10-CM | POA: Diagnosis present

## 2017-12-30 DIAGNOSIS — M4327 Fusion of spine, lumbosacral region: Secondary | ICD-10-CM | POA: Diagnosis not present

## 2017-12-30 DIAGNOSIS — M961 Postlaminectomy syndrome, not elsewhere classified: Secondary | ICD-10-CM | POA: Diagnosis not present

## 2017-12-30 DIAGNOSIS — K219 Gastro-esophageal reflux disease without esophagitis: Secondary | ICD-10-CM | POA: Diagnosis present

## 2017-12-30 DIAGNOSIS — Z981 Arthrodesis status: Secondary | ICD-10-CM | POA: Diagnosis not present

## 2017-12-30 DIAGNOSIS — Z7982 Long term (current) use of aspirin: Secondary | ICD-10-CM | POA: Diagnosis not present

## 2017-12-30 DIAGNOSIS — M4727 Other spondylosis with radiculopathy, lumbosacral region: Secondary | ICD-10-CM | POA: Diagnosis present

## 2017-12-30 DIAGNOSIS — M4726 Other spondylosis with radiculopathy, lumbar region: Secondary | ICD-10-CM | POA: Insufficient documentation

## 2017-12-30 DIAGNOSIS — T84216A Breakdown (mechanical) of internal fixation device of vertebrae, initial encounter: Secondary | ICD-10-CM | POA: Diagnosis not present

## 2017-12-30 DIAGNOSIS — Z79899 Other long term (current) drug therapy: Secondary | ICD-10-CM | POA: Diagnosis not present

## 2017-12-30 DIAGNOSIS — M545 Low back pain: Secondary | ICD-10-CM | POA: Diagnosis not present

## 2017-12-30 DIAGNOSIS — M48062 Spinal stenosis, lumbar region with neurogenic claudication: Secondary | ICD-10-CM | POA: Diagnosis not present

## 2017-12-30 DIAGNOSIS — K759 Inflammatory liver disease, unspecified: Secondary | ICD-10-CM | POA: Diagnosis present

## 2017-12-30 DIAGNOSIS — M5106 Intervertebral disc disorders with myelopathy, lumbar region: Secondary | ICD-10-CM | POA: Diagnosis not present

## 2017-12-30 DIAGNOSIS — M5416 Radiculopathy, lumbar region: Secondary | ICD-10-CM | POA: Diagnosis not present

## 2017-12-30 DIAGNOSIS — M4316 Spondylolisthesis, lumbar region: Secondary | ICD-10-CM | POA: Diagnosis not present

## 2018-01-28 DIAGNOSIS — Z981 Arthrodesis status: Secondary | ICD-10-CM | POA: Diagnosis not present

## 2018-01-28 DIAGNOSIS — R945 Abnormal results of liver function studies: Secondary | ICD-10-CM | POA: Diagnosis not present

## 2018-01-28 DIAGNOSIS — M545 Low back pain: Secondary | ICD-10-CM | POA: Diagnosis not present

## 2018-03-26 DIAGNOSIS — M4326 Fusion of spine, lumbar region: Secondary | ICD-10-CM | POA: Diagnosis not present

## 2018-04-21 DIAGNOSIS — Z23 Encounter for immunization: Secondary | ICD-10-CM | POA: Diagnosis not present

## 2018-04-21 DIAGNOSIS — I10 Essential (primary) hypertension: Secondary | ICD-10-CM | POA: Diagnosis not present

## 2018-04-21 DIAGNOSIS — R945 Abnormal results of liver function studies: Secondary | ICD-10-CM | POA: Diagnosis not present

## 2018-04-21 DIAGNOSIS — E785 Hyperlipidemia, unspecified: Secondary | ICD-10-CM | POA: Diagnosis not present

## 2018-04-21 DIAGNOSIS — M1990 Unspecified osteoarthritis, unspecified site: Secondary | ICD-10-CM | POA: Diagnosis not present

## 2018-06-09 DIAGNOSIS — L570 Actinic keratosis: Secondary | ICD-10-CM | POA: Diagnosis not present

## 2018-06-09 DIAGNOSIS — L309 Dermatitis, unspecified: Secondary | ICD-10-CM | POA: Diagnosis not present

## 2018-06-09 DIAGNOSIS — L821 Other seborrheic keratosis: Secondary | ICD-10-CM | POA: Diagnosis not present

## 2018-06-09 DIAGNOSIS — L578 Other skin changes due to chronic exposure to nonionizing radiation: Secondary | ICD-10-CM | POA: Diagnosis not present

## 2018-06-09 DIAGNOSIS — D045 Carcinoma in situ of skin of trunk: Secondary | ICD-10-CM | POA: Diagnosis not present

## 2018-06-23 DIAGNOSIS — R945 Abnormal results of liver function studies: Secondary | ICD-10-CM | POA: Diagnosis not present

## 2018-06-25 DIAGNOSIS — M4326 Fusion of spine, lumbar region: Secondary | ICD-10-CM | POA: Diagnosis not present

## 2018-06-25 DIAGNOSIS — M545 Low back pain: Secondary | ICD-10-CM | POA: Diagnosis not present

## 2018-06-25 DIAGNOSIS — M5412 Radiculopathy, cervical region: Secondary | ICD-10-CM | POA: Diagnosis not present

## 2018-06-25 DIAGNOSIS — M5030 Other cervical disc degeneration, unspecified cervical region: Secondary | ICD-10-CM | POA: Diagnosis not present

## 2018-06-25 DIAGNOSIS — M25511 Pain in right shoulder: Secondary | ICD-10-CM | POA: Diagnosis not present

## 2018-06-26 DIAGNOSIS — J208 Acute bronchitis due to other specified organisms: Secondary | ICD-10-CM | POA: Diagnosis not present

## 2018-06-26 DIAGNOSIS — D519 Vitamin B12 deficiency anemia, unspecified: Secondary | ICD-10-CM | POA: Diagnosis not present

## 2018-06-26 DIAGNOSIS — B373 Candidiasis of vulva and vagina: Secondary | ICD-10-CM | POA: Diagnosis not present

## 2018-06-30 DIAGNOSIS — M545 Low back pain: Secondary | ICD-10-CM | POA: Diagnosis not present

## 2018-07-03 DIAGNOSIS — Z136 Encounter for screening for cardiovascular disorders: Secondary | ICD-10-CM | POA: Diagnosis not present

## 2018-07-03 DIAGNOSIS — Z9181 History of falling: Secondary | ICD-10-CM | POA: Diagnosis not present

## 2018-07-03 DIAGNOSIS — E785 Hyperlipidemia, unspecified: Secondary | ICD-10-CM | POA: Diagnosis not present

## 2018-07-03 DIAGNOSIS — Z0000 Encounter for general adult medical examination without abnormal findings: Secondary | ICD-10-CM | POA: Diagnosis not present

## 2018-07-03 DIAGNOSIS — N959 Unspecified menopausal and perimenopausal disorder: Secondary | ICD-10-CM | POA: Diagnosis not present

## 2018-07-03 DIAGNOSIS — Z1231 Encounter for screening mammogram for malignant neoplasm of breast: Secondary | ICD-10-CM | POA: Diagnosis not present

## 2018-07-03 DIAGNOSIS — Z6832 Body mass index (BMI) 32.0-32.9, adult: Secondary | ICD-10-CM | POA: Diagnosis not present

## 2018-07-03 DIAGNOSIS — E669 Obesity, unspecified: Secondary | ICD-10-CM | POA: Diagnosis not present

## 2018-07-03 DIAGNOSIS — Z1331 Encounter for screening for depression: Secondary | ICD-10-CM | POA: Diagnosis not present

## 2018-07-04 DIAGNOSIS — M545 Low back pain: Secondary | ICD-10-CM | POA: Diagnosis not present

## 2018-07-07 DIAGNOSIS — M545 Low back pain: Secondary | ICD-10-CM | POA: Diagnosis not present

## 2018-07-10 DIAGNOSIS — M545 Low back pain: Secondary | ICD-10-CM | POA: Diagnosis not present

## 2018-07-16 DIAGNOSIS — M545 Low back pain: Secondary | ICD-10-CM | POA: Diagnosis not present

## 2018-07-21 DIAGNOSIS — M545 Low back pain: Secondary | ICD-10-CM | POA: Diagnosis not present

## 2018-07-28 DIAGNOSIS — M545 Low back pain: Secondary | ICD-10-CM | POA: Diagnosis not present

## 2018-08-28 DIAGNOSIS — M4326 Fusion of spine, lumbar region: Secondary | ICD-10-CM | POA: Diagnosis not present

## 2018-08-28 DIAGNOSIS — M545 Low back pain: Secondary | ICD-10-CM | POA: Diagnosis not present

## 2018-08-28 DIAGNOSIS — M25511 Pain in right shoulder: Secondary | ICD-10-CM | POA: Diagnosis not present

## 2018-08-28 DIAGNOSIS — M5030 Other cervical disc degeneration, unspecified cervical region: Secondary | ICD-10-CM | POA: Diagnosis not present

## 2018-09-02 DIAGNOSIS — M75121 Complete rotator cuff tear or rupture of right shoulder, not specified as traumatic: Secondary | ICD-10-CM | POA: Diagnosis not present

## 2018-09-02 DIAGNOSIS — M62511 Muscle wasting and atrophy, not elsewhere classified, right shoulder: Secondary | ICD-10-CM | POA: Diagnosis not present

## 2018-09-02 DIAGNOSIS — X58XXXD Exposure to other specified factors, subsequent encounter: Secondary | ICD-10-CM | POA: Diagnosis not present

## 2018-09-02 DIAGNOSIS — M19011 Primary osteoarthritis, right shoulder: Secondary | ICD-10-CM | POA: Diagnosis not present

## 2018-09-02 DIAGNOSIS — S43401A Unspecified sprain of right shoulder joint, initial encounter: Secondary | ICD-10-CM | POA: Diagnosis not present

## 2018-09-02 DIAGNOSIS — M25511 Pain in right shoulder: Secondary | ICD-10-CM | POA: Diagnosis not present

## 2018-09-02 DIAGNOSIS — S46211D Strain of muscle, fascia and tendon of other parts of biceps, right arm, subsequent encounter: Secondary | ICD-10-CM | POA: Diagnosis not present

## 2018-09-10 DIAGNOSIS — M75101 Unspecified rotator cuff tear or rupture of right shoulder, not specified as traumatic: Secondary | ICD-10-CM | POA: Diagnosis not present

## 2018-09-10 DIAGNOSIS — I10 Essential (primary) hypertension: Secondary | ICD-10-CM | POA: Diagnosis not present

## 2018-09-10 DIAGNOSIS — M542 Cervicalgia: Secondary | ICD-10-CM | POA: Diagnosis not present

## 2018-09-10 DIAGNOSIS — Z6829 Body mass index (BMI) 29.0-29.9, adult: Secondary | ICD-10-CM | POA: Diagnosis not present

## 2018-09-10 DIAGNOSIS — M545 Low back pain: Secondary | ICD-10-CM | POA: Diagnosis not present

## 2018-09-12 DIAGNOSIS — M25511 Pain in right shoulder: Secondary | ICD-10-CM | POA: Diagnosis not present

## 2018-09-16 DIAGNOSIS — M25511 Pain in right shoulder: Secondary | ICD-10-CM | POA: Diagnosis not present

## 2018-09-18 DIAGNOSIS — M25511 Pain in right shoulder: Secondary | ICD-10-CM | POA: Diagnosis not present

## 2018-09-23 DIAGNOSIS — M25511 Pain in right shoulder: Secondary | ICD-10-CM | POA: Diagnosis not present

## 2018-09-25 DIAGNOSIS — M25511 Pain in right shoulder: Secondary | ICD-10-CM | POA: Diagnosis not present

## 2018-09-30 DIAGNOSIS — M25511 Pain in right shoulder: Secondary | ICD-10-CM | POA: Diagnosis not present

## 2018-10-09 DIAGNOSIS — M5030 Other cervical disc degeneration, unspecified cervical region: Secondary | ICD-10-CM | POA: Diagnosis not present

## 2018-10-09 DIAGNOSIS — M7918 Myalgia, other site: Secondary | ICD-10-CM | POA: Diagnosis not present

## 2018-10-09 DIAGNOSIS — M545 Low back pain: Secondary | ICD-10-CM | POA: Diagnosis not present

## 2018-10-09 DIAGNOSIS — M5412 Radiculopathy, cervical region: Secondary | ICD-10-CM | POA: Diagnosis not present

## 2018-10-09 DIAGNOSIS — M75101 Unspecified rotator cuff tear or rupture of right shoulder, not specified as traumatic: Secondary | ICD-10-CM | POA: Diagnosis not present

## 2018-10-15 DIAGNOSIS — Z6832 Body mass index (BMI) 32.0-32.9, adult: Secondary | ICD-10-CM | POA: Diagnosis not present

## 2018-10-15 DIAGNOSIS — I10 Essential (primary) hypertension: Secondary | ICD-10-CM | POA: Insufficient documentation

## 2018-10-15 DIAGNOSIS — Z01419 Encounter for gynecological examination (general) (routine) without abnormal findings: Secondary | ICD-10-CM | POA: Diagnosis not present

## 2018-10-15 DIAGNOSIS — N952 Postmenopausal atrophic vaginitis: Secondary | ICD-10-CM | POA: Diagnosis not present

## 2018-10-15 DIAGNOSIS — G2581 Restless legs syndrome: Secondary | ICD-10-CM | POA: Insufficient documentation

## 2018-10-15 DIAGNOSIS — E785 Hyperlipidemia, unspecified: Secondary | ICD-10-CM | POA: Insufficient documentation

## 2018-10-21 DIAGNOSIS — S46011A Strain of muscle(s) and tendon(s) of the rotator cuff of right shoulder, initial encounter: Secondary | ICD-10-CM | POA: Diagnosis not present

## 2018-10-28 ENCOUNTER — Ambulatory Visit: Payer: Self-pay | Admitting: Physician Assistant

## 2018-10-28 ENCOUNTER — Other Ambulatory Visit (HOSPITAL_COMMUNITY)
Admission: RE | Admit: 2018-10-28 | Discharge: 2018-10-28 | Disposition: A | Discharge status: Home / Self Care | Payer: Medicare Other | Source: Ambulatory Visit | Attending: Orthopedic Surgery | Admitting: Orthopedic Surgery

## 2018-10-28 ENCOUNTER — Other Ambulatory Visit: Payer: Self-pay

## 2018-10-28 ENCOUNTER — Encounter (HOSPITAL_BASED_OUTPATIENT_CLINIC_OR_DEPARTMENT_OTHER): Payer: Self-pay | Admitting: *Deleted

## 2018-10-28 DIAGNOSIS — M1990 Unspecified osteoarthritis, unspecified site: Secondary | ICD-10-CM | POA: Diagnosis not present

## 2018-10-28 DIAGNOSIS — E785 Hyperlipidemia, unspecified: Secondary | ICD-10-CM | POA: Diagnosis not present

## 2018-10-28 DIAGNOSIS — I10 Essential (primary) hypertension: Secondary | ICD-10-CM | POA: Diagnosis not present

## 2018-10-28 DIAGNOSIS — Z1159 Encounter for screening for other viral diseases: Secondary | ICD-10-CM | POA: Diagnosis not present

## 2018-10-28 DIAGNOSIS — R945 Abnormal results of liver function studies: Secondary | ICD-10-CM | POA: Diagnosis not present

## 2018-10-28 LAB — SARS CORONAVIRUS 2 BY RT PCR (HOSPITAL ORDER, PERFORMED IN ~~LOC~~ HOSPITAL LAB): SARS Coronavirus 2: NEGATIVE

## 2018-10-28 NOTE — H&P (Signed)
Linda Mcdonald is an 68 y.o. female.   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  She does have past medical history of hypertension.  Family history of diabetes mellitus, hypertension and coronary artery disease.  Medications include HCTZ, gabapentin, omeprazole, Meloxicam, Esterase, over-the-counter vitamins.  Scan findings include a full thickness tear, near full width.  AC arthritis, mild.  Moderate degenerative changes of the shoulder.  She also has cervical spine issues with some pathology on the right side that could be affecting her parascapular area, as well as pathology affecting the left arm, which is not related to her complaints regarding the right shoulder.    No past medical history on file.    No family history on file. Social History:  has no history on file for tobacco, alcohol, and drug.  Allergies: Not on File  (Not in a hospital admission)   No results found for this or any previous visit (from the past 48 hour(s)). No results found.  Review of Systems  Musculoskeletal: Positive for joint pain.  All other systems reviewed and are negative.   There were no vitals taken for this visit. Physical Exam  Constitutional: She is oriented to person, place, and time. She appears well-developed and well-nourished. No distress.  HENT:  Head: Normocephalic and atraumatic.  Eyes: Pupils are equal, round, and reactive to light. Conjunctivae and EOM are normal.  Neck: Normal range of motion. Neck supple.  Cardiovascular: Normal rate and intact distal pulses.  Respiratory: Effort normal. No respiratory distress.  Musculoskeletal:     Right shoulder: She exhibits decreased range of motion, tenderness, swelling, pain and decreased strength.  Neurological: She is alert and oriented to person, place, and time.  Skin: Skin is warm and dry. No rash noted. No erythema.  Psychiatric: She has a normal mood and  affect. Her behavior is normal.     Assessment/Plan I am concerned about her shoulder and the potential that a cuff tear may become irreparable.  There is some chance it would be irreparable now, but hopefully not.  I recommend arthroscopy, acromioplasty, distal clavicle, rotator cuff repair right.  She understands that there is a 1 out of 4 chance that this will not be repairable.  The alternative at this point would be a debridement without reconstruction, which would not be optimal, and a potential reverse shoulder arthroplasty, which would be too aggressive.  She understands and may come to that in the future.  Risks and benefits are discussed in detail.  We will proceed on with scheduling as an outpatient with general anesthetic and nerve block.    Chriss Czar, PA-C 10/28/2018, 5:46 PM

## 2018-10-28 NOTE — H&P (View-Only) (Signed)
Linda Mcdonald is an 68 y.o. female.   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  She does have past medical history of hypertension.  Family history of diabetes mellitus, hypertension and coronary artery disease.  Medications include HCTZ, gabapentin, omeprazole, Meloxicam, Esterase, over-the-counter vitamins.  Scan findings include a full thickness tear, near full width.  AC arthritis, mild.  Moderate degenerative changes of the shoulder.  She also has cervical spine issues with some pathology on the right side that could be affecting her parascapular area, as well as pathology affecting the left arm, which is not related to her complaints regarding the right shoulder.    No past medical history on file.    No family history on file. Social History:  has no history on file for tobacco, alcohol, and drug.  Allergies: Not on File  (Not in a hospital admission)   No results found for this or any previous visit (from the past 48 hour(s)). No results found.  Review of Systems  Musculoskeletal: Positive for joint pain.  All other systems reviewed and are negative.   There were no vitals taken for this visit. Physical Exam  Constitutional: She is oriented to person, place, and time. She appears well-developed and well-nourished. No distress.  HENT:  Head: Normocephalic and atraumatic.  Eyes: Pupils are equal, round, and reactive to light. Conjunctivae and EOM are normal.  Neck: Normal range of motion. Neck supple.  Cardiovascular: Normal rate and intact distal pulses.  Respiratory: Effort normal. No respiratory distress.  Musculoskeletal:     Right shoulder: She exhibits decreased range of motion, tenderness, swelling, pain and decreased strength.  Neurological: She is alert and oriented to person, place, and time.  Skin: Skin is warm and dry. No rash noted. No erythema.  Psychiatric: She has a normal mood and  affect. Her behavior is normal.     Assessment/Plan I am concerned about her shoulder and the potential that a cuff tear may become irreparable.  There is some chance it would be irreparable now, but hopefully not.  I recommend arthroscopy, acromioplasty, distal clavicle, rotator cuff repair right.  She understands that there is a 1 out of 4 chance that this will not be repairable.  The alternative at this point would be a debridement without reconstruction, which would not be optimal, and a potential reverse shoulder arthroplasty, which would be too aggressive.  She understands and may come to that in the future.  Risks and benefits are discussed in detail.  We will proceed on with scheduling as an outpatient with general anesthetic and nerve block.    Chriss Czar, PA-C 10/28/2018, 5:46 PM

## 2018-10-29 ENCOUNTER — Encounter (HOSPITAL_BASED_OUTPATIENT_CLINIC_OR_DEPARTMENT_OTHER)
Admission: RE | Disposition: A | Discharge status: Home / Self Care | Payer: Self-pay | Source: Home / Self Care | Attending: Orthopedic Surgery

## 2018-10-29 ENCOUNTER — Ambulatory Visit: Payer: Self-pay | Admitting: Physician Assistant

## 2018-10-29 ENCOUNTER — Ambulatory Visit (HOSPITAL_BASED_OUTPATIENT_CLINIC_OR_DEPARTMENT_OTHER): Payer: Medicare Other | Admitting: Anesthesiology

## 2018-10-29 ENCOUNTER — Ambulatory Visit (HOSPITAL_BASED_OUTPATIENT_CLINIC_OR_DEPARTMENT_OTHER)
Admission: RE | Admit: 2018-10-29 | Discharge: 2018-10-29 | Disposition: A | Discharge status: Home / Self Care | Payer: Medicare Other | Attending: Orthopedic Surgery | Admitting: Orthopedic Surgery

## 2018-10-29 ENCOUNTER — Encounter (HOSPITAL_BASED_OUTPATIENT_CLINIC_OR_DEPARTMENT_OTHER): Payer: Self-pay | Admitting: *Deleted

## 2018-10-29 DIAGNOSIS — I10 Essential (primary) hypertension: Secondary | ICD-10-CM | POA: Insufficient documentation

## 2018-10-29 DIAGNOSIS — Z79899 Other long term (current) drug therapy: Secondary | ICD-10-CM | POA: Diagnosis not present

## 2018-10-29 DIAGNOSIS — M25811 Other specified joint disorders, right shoulder: Secondary | ICD-10-CM | POA: Insufficient documentation

## 2018-10-29 DIAGNOSIS — K219 Gastro-esophageal reflux disease without esophagitis: Secondary | ICD-10-CM | POA: Insufficient documentation

## 2018-10-29 DIAGNOSIS — M75121 Complete rotator cuff tear or rupture of right shoulder, not specified as traumatic: Secondary | ICD-10-CM | POA: Diagnosis not present

## 2018-10-29 DIAGNOSIS — M24111 Other articular cartilage disorders, right shoulder: Secondary | ICD-10-CM | POA: Diagnosis not present

## 2018-10-29 DIAGNOSIS — G8918 Other acute postprocedural pain: Secondary | ICD-10-CM | POA: Diagnosis not present

## 2018-10-29 DIAGNOSIS — M19011 Primary osteoarthritis, right shoulder: Secondary | ICD-10-CM | POA: Insufficient documentation

## 2018-10-29 DIAGNOSIS — M7541 Impingement syndrome of right shoulder: Secondary | ICD-10-CM | POA: Diagnosis not present

## 2018-10-29 DIAGNOSIS — M25511 Pain in right shoulder: Secondary | ICD-10-CM | POA: Diagnosis present

## 2018-10-29 DIAGNOSIS — M75101 Unspecified rotator cuff tear or rupture of right shoulder, not specified as traumatic: Secondary | ICD-10-CM | POA: Insufficient documentation

## 2018-10-29 DIAGNOSIS — Z791 Long term (current) use of non-steroidal anti-inflammatories (NSAID): Secondary | ICD-10-CM | POA: Insufficient documentation

## 2018-10-29 HISTORY — DX: Unspecified disorder of synovium and tendon, right shoulder: M67.911

## 2018-10-29 HISTORY — DX: Gastro-esophageal reflux disease without esophagitis: K21.9

## 2018-10-29 HISTORY — PX: SHOULDER ARTHROSCOPY WITH SUBACROMIAL DECOMPRESSION: SHX5684

## 2018-10-29 HISTORY — DX: Other chronic pain: G89.29

## 2018-10-29 HISTORY — DX: Restless legs syndrome: G25.81

## 2018-10-29 HISTORY — PX: SHOULDER ARTHROSCOPY WITH ROTATOR CUFF REPAIR: SHX5685

## 2018-10-29 HISTORY — DX: Essential (primary) hypertension: I10

## 2018-10-29 SURGERY — ARTHROSCOPY, SHOULDER, WITH ROTATOR CUFF REPAIR
Anesthesia: General | Site: Shoulder | Laterality: Right

## 2018-10-29 MED ORDER — EPHEDRINE SULFATE-NACL 50-0.9 MG/10ML-% IV SOSY
PREFILLED_SYRINGE | INTRAVENOUS | Status: DC | PRN
  Administered 2018-10-29 (×2): 5 mg via INTRAVENOUS

## 2018-10-29 MED ORDER — HYDROMORPHONE HCL 1 MG/ML IJ SOLN: 0.2500 mg | INTRAMUSCULAR | Status: DC | PRN

## 2018-10-29 MED ORDER — ONDANSETRON HCL 4 MG/2ML IJ SOLN: 4.0000 mg | Freq: Once | INTRAMUSCULAR | Status: DC | PRN

## 2018-10-29 MED ORDER — OXYCODONE HCL 5 MG PO TABS
ORAL_TABLET | ORAL | Status: AC
  Filled 2018-10-29: qty 1

## 2018-10-29 MED ORDER — ROCURONIUM BROMIDE 10 MG/ML (PF) SYRINGE
PREFILLED_SYRINGE | INTRAVENOUS | Status: AC
  Filled 2018-10-29: qty 10

## 2018-10-29 MED ORDER — ONDANSETRON HCL 4 MG/2ML IJ SOLN
INTRAMUSCULAR | Status: AC
  Filled 2018-10-29: qty 2

## 2018-10-29 MED ORDER — DEXAMETHASONE SODIUM PHOSPHATE 10 MG/ML IJ SOLN
INTRAMUSCULAR | Status: AC
  Filled 2018-10-29: qty 1

## 2018-10-29 MED ORDER — LIDOCAINE HCL (CARDIAC) PF 100 MG/5ML IV SOSY
PREFILLED_SYRINGE | INTRAVENOUS | Status: DC | PRN
  Administered 2018-10-29: 100 mg via INTRAVENOUS

## 2018-10-29 MED ORDER — CHLORHEXIDINE GLUCONATE 4 % EX LIQD: 60.0000 mL | Freq: Once | CUTANEOUS | Status: DC

## 2018-10-29 MED ORDER — BUPIVACAINE LIPOSOME 1.3 % IJ SUSP
INTRAMUSCULAR | Status: DC | PRN
  Administered 2018-10-29: 10 mL via PERINEURAL

## 2018-10-29 MED ORDER — ROCURONIUM BROMIDE 100 MG/10ML IV SOLN
INTRAVENOUS | Status: DC | PRN
  Administered 2018-10-29: 60 mg via INTRAVENOUS

## 2018-10-29 MED ORDER — SODIUM CHLORIDE 0.9 % IV SOLN: INTRAVENOUS | Status: DC

## 2018-10-29 MED ORDER — DEXAMETHASONE SODIUM PHOSPHATE 4 MG/ML IJ SOLN
INTRAMUSCULAR | Status: DC | PRN
  Administered 2018-10-29: 10 mg via INTRAVENOUS

## 2018-10-29 MED ORDER — SUGAMMADEX SODIUM 200 MG/2ML IV SOLN
INTRAVENOUS | Status: AC
  Filled 2018-10-29: qty 2

## 2018-10-29 MED ORDER — CEFAZOLIN SODIUM-DEXTROSE 2-4 GM/100ML-% IV SOLN
INTRAVENOUS | Status: AC
  Filled 2018-10-29: qty 100

## 2018-10-29 MED ORDER — PROPOFOL 10 MG/ML IV BOLUS
INTRAVENOUS | Status: DC | PRN
  Administered 2018-10-29: 130 mg via INTRAVENOUS

## 2018-10-29 MED ORDER — MEPERIDINE HCL 25 MG/ML IJ SOLN: 6.2500 mg | INTRAMUSCULAR | Status: DC | PRN

## 2018-10-29 MED ORDER — BUPIVACAINE-EPINEPHRINE (PF) 0.5% -1:200000 IJ SOLN
INTRAMUSCULAR | Status: DC | PRN
  Administered 2018-10-29: 20 mL via PERINEURAL

## 2018-10-29 MED ORDER — OXYCODONE HCL 5 MG PO TABS
5.0000 mg | ORAL_TABLET | Freq: Once | ORAL | Status: AC
  Administered 2018-10-29: 5 mg via ORAL

## 2018-10-29 MED ORDER — CEFAZOLIN SODIUM-DEXTROSE 2-4 GM/100ML-% IV SOLN
2.0000 g | INTRAVENOUS | Status: AC
  Administered 2018-10-29: 12:00:00 2 g via INTRAVENOUS

## 2018-10-29 MED ORDER — OXYCODONE HCL 5 MG PO TABS: ORAL_TABLET | ORAL | 0 refills | Status: AC

## 2018-10-29 MED ORDER — ONDANSETRON HCL 4 MG/2ML IJ SOLN
INTRAMUSCULAR | Status: DC | PRN
  Administered 2018-10-29: 4 mg via INTRAVENOUS

## 2018-10-29 MED ORDER — MIDAZOLAM HCL 2 MG/2ML IJ SOLN
INTRAMUSCULAR | Status: AC
  Filled 2018-10-29: qty 2

## 2018-10-29 MED ORDER — EPHEDRINE 5 MG/ML INJ
INTRAVENOUS | Status: AC
  Filled 2018-10-29: qty 10

## 2018-10-29 MED ORDER — SUGAMMADEX SODIUM 200 MG/2ML IV SOLN
INTRAVENOUS | Status: DC | PRN
  Administered 2018-10-29: 200 mg via INTRAVENOUS

## 2018-10-29 MED ORDER — FENTANYL CITRATE (PF) 100 MCG/2ML IJ SOLN
INTRAMUSCULAR | Status: AC
  Filled 2018-10-29: qty 2

## 2018-10-29 MED ORDER — ACETAMINOPHEN 325 MG PO TABS: 650.0000 mg | ORAL_TABLET | ORAL | 2 refills | Status: AC | PRN

## 2018-10-29 MED ORDER — SCOPOLAMINE 1 MG/3DAYS TD PT72: 1.0000 | MEDICATED_PATCH | Freq: Once | TRANSDERMAL | Status: DC

## 2018-10-29 MED ORDER — FENTANYL CITRATE (PF) 100 MCG/2ML IJ SOLN
50.0000 ug | INTRAMUSCULAR | Status: DC | PRN
  Administered 2018-10-29: 50 ug via INTRAVENOUS

## 2018-10-29 MED ORDER — MIDAZOLAM HCL 2 MG/2ML IJ SOLN
1.0000 mg | INTRAMUSCULAR | Status: DC | PRN
  Administered 2018-10-29: 2 mg via INTRAVENOUS

## 2018-10-29 MED ORDER — SUCCINYLCHOLINE CHLORIDE 200 MG/10ML IV SOSY
PREFILLED_SYRINGE | INTRAVENOUS | Status: AC
  Filled 2018-10-29: qty 10

## 2018-10-29 MED ORDER — LACTATED RINGERS IV SOLN
INTRAVENOUS | Status: DC
  Administered 2018-10-29 (×2): via INTRAVENOUS

## 2018-10-29 SURGICAL SUPPLY — 79 items
ANCHOR SUT BIO SW 4.75X19.1 (Anchor) ×4 IMPLANT
BENZOIN TINCTURE PRP APPL 2/3 (GAUZE/BANDAGES/DRESSINGS) IMPLANT
BLADE AVERAGE 25X9 (BLADE) ×2 IMPLANT
BLADE SURG 15 STRL LF DISP TIS (BLADE) IMPLANT
BLADE SURG 15 STRL SS (BLADE)
BLADE VORTEX 6.0 (BLADE) IMPLANT
BUR 3.5 LG SPHERICAL (BURR) IMPLANT
BUR EGG 3PK/BX (BURR) ×2 IMPLANT
BUR OVAL 4.0 (BURR) IMPLANT
BUR VERTEX HOODED 4.5 (BURR) IMPLANT
BURR 3.5 LG SPHERICAL (BURR)
BURR OVAL 8 FLU 5.0X13 (MISCELLANEOUS) ×2 IMPLANT
CANNULA SHOULDER 7CM (CANNULA) ×2 IMPLANT
CANNULA TWIST IN 8.25X7CM (CANNULA) IMPLANT
CLEANER CAUTERY TIP 5X5 PAD (MISCELLANEOUS) ×1 IMPLANT
COVER WAND RF STERILE (DRAPES) IMPLANT
DECANTER SPIKE VIAL GLASS SM (MISCELLANEOUS) IMPLANT
DISSECTOR  3.8MM X 13CM (MISCELLANEOUS) ×1
DISSECTOR 3.8MM X 13CM (MISCELLANEOUS) ×1 IMPLANT
DISSECTOR 4.0MM X 13CM (MISCELLANEOUS) ×2 IMPLANT
DRAPE STERI 35X30 U-POUCH (DRAPES) ×2 IMPLANT
DRAPE SURG 17X23 STRL (DRAPES) ×2 IMPLANT
DRAPE U-SHAPE 76X120 STRL (DRAPES) ×4 IMPLANT
DRSG EMULSION OIL 3X3 NADH (GAUZE/BANDAGES/DRESSINGS) ×2 IMPLANT
DRSG PAD ABDOMINAL 8X10 ST (GAUZE/BANDAGES/DRESSINGS) ×2 IMPLANT
DURAPREP 26ML APPLICATOR (WOUND CARE) ×2 IMPLANT
ELECT REM PT RETURN 9FT ADLT (ELECTROSURGICAL) ×2
ELECTRODE REM PT RTRN 9FT ADLT (ELECTROSURGICAL) ×1 IMPLANT
GAUZE SPONGE 4X4 12PLY STRL (GAUZE/BANDAGES/DRESSINGS) ×2 IMPLANT
GLOVE BIO SURGEON STRL SZ 6.5 (GLOVE) ×2 IMPLANT
GLOVE BIO SURGEON STRL SZ7.5 (GLOVE) ×2 IMPLANT
GLOVE BIOGEL PI IND STRL 7.0 (GLOVE) ×2 IMPLANT
GLOVE BIOGEL PI IND STRL 8 (GLOVE) ×2 IMPLANT
GLOVE BIOGEL PI INDICATOR 7.0 (GLOVE) ×2
GLOVE BIOGEL PI INDICATOR 8 (GLOVE) ×2
GLOVE SURG ORTHO 8.0 STRL STRW (GLOVE) ×2 IMPLANT
GOWN STRL REUS W/ TWL LRG LVL3 (GOWN DISPOSABLE) ×1 IMPLANT
GOWN STRL REUS W/ TWL XL LVL3 (GOWN DISPOSABLE) ×1 IMPLANT
GOWN STRL REUS W/TWL LRG LVL3 (GOWN DISPOSABLE) ×1
GOWN STRL REUS W/TWL XL LVL3 (GOWN DISPOSABLE) ×3 IMPLANT
MANIFOLD NEPTUNE II (INSTRUMENTS) ×2 IMPLANT
NEEDLE 1/2 CIR CATGUT .05X1.09 (NEEDLE) IMPLANT
NEEDLE SCORPION MULTI FIRE (NEEDLE) ×2 IMPLANT
NS IRRIG 1000ML POUR BTL (IV SOLUTION) ×2 IMPLANT
PACK ARTHROSCOPY DSU (CUSTOM PROCEDURE TRAY) ×2 IMPLANT
PACK BASIN DAY SURGERY FS (CUSTOM PROCEDURE TRAY) ×2 IMPLANT
PAD CLEANER CAUTERY TIP 5X5 (MISCELLANEOUS) ×1
PAD ORTHO SHOULDER 7X19 LRG (SOFTGOODS) IMPLANT
PENCIL BUTTON HOLSTER BLD 10FT (ELECTRODE) ×2 IMPLANT
PORT APPOLLO RF 90DEGREE MULTI (SURGICAL WAND) ×2 IMPLANT
RESTRAINT HEAD UNIVERSAL NS (MISCELLANEOUS) ×2 IMPLANT
SLEEVE SCD COMPRESS KNEE MED (MISCELLANEOUS) ×2 IMPLANT
SLING ARM FOAM STRAP LRG (SOFTGOODS) IMPLANT
SLING ARM MED ADULT FOAM STRAP (SOFTGOODS) IMPLANT
SLING ULTRA II MEDIUM (SOFTGOODS) IMPLANT
SLING ULTRA II SMALL (SOFTGOODS) IMPLANT
SPONGE LAP 4X18 RFD (DISPOSABLE) IMPLANT
STAPLER VISISTAT 35W (STAPLE) IMPLANT
STRIP CLOSURE SKIN 1/2X4 (GAUZE/BANDAGES/DRESSINGS) IMPLANT
SUCTION FRAZIER HANDLE 10FR (MISCELLANEOUS)
SUCTION TUBE FRAZIER 10FR DISP (MISCELLANEOUS) IMPLANT
SUT BONE WAX W31G (SUTURE) IMPLANT
SUT ETHILON 3 0 PS 1 (SUTURE) ×2 IMPLANT
SUT FIBERWIRE #2 38 T-5 BLUE (SUTURE) ×2
SUT MNCRL AB 3-0 PS2 18 (SUTURE) IMPLANT
SUT TICRON 1 T 12 (SUTURE) IMPLANT
SUT TIGER TAPE 7 IN WHITE (SUTURE) ×4 IMPLANT
SUT VIC AB 0 CT1 27 (SUTURE) ×1
SUT VIC AB 0 CT1 27XBRD ANBCTR (SUTURE) ×1 IMPLANT
SUT VIC AB 1 CT1 27 (SUTURE)
SUT VIC AB 1 CT1 27XBRD ANBCTR (SUTURE) IMPLANT
SUT VIC AB 2-0 SH 27 (SUTURE) ×1
SUT VIC AB 2-0 SH 27XBRD (SUTURE) ×1 IMPLANT
SUTURE FIBERWR #2 38 T-5 BLUE (SUTURE) ×1 IMPLANT
TAPE FIBER 2MM 7IN #2 BLUE (SUTURE) IMPLANT
TOWEL GREEN STERILE FF (TOWEL DISPOSABLE) ×2 IMPLANT
TUBING ARTHROSCOPY IRRIG 16FT (MISCELLANEOUS) ×2 IMPLANT
WATER STERILE IRR 1000ML POUR (IV SOLUTION) ×2 IMPLANT
YANKAUER SUCT BULB TIP NO VENT (SUCTIONS) ×2 IMPLANT

## 2018-10-29 NOTE — Discharge Instructions (Signed)
Diet: As you were doing prior to hospitalization   Activity: Increase activity slowly as tolerated  No lifting or driving for 6 weeks   Shower: May shower on post op day #3 change dressing after shower, NO SOAKING in tub   Dressing: You may change your dressing on post op day #3.  Then change the dressing daily with sterile 4"x4"s gauze dressing   Weight Bearing: nonweight bearing right arm, remain in sling at all times with pillow spint except bathing keeping arm close to body.  To prevent constipation: you may use a stool softener such as -  Colace ( over the counter) 100 mg by mouth twice a day  Drink plenty of fluids ( prune juice may be helpful) and high fiber foods  Miralax ( over the counter) for constipation as needed.   Precautions: If you experience chest pain or shortness of breath - call 911 immediately For transfer to the hospital emergency department!!  If you develop a fever greater that 101 F, purulent drainage from wound, increased redness or drainage from wound, or calf pain -- Call the office   Follow- Up Appointment: Please call for an appointment to be seen in 1 week or as previously scheduled  [facility] - [phone]     Post Anesthesia Home Care Instructions  Activity: Get plenty of rest for the remainder of the day. A responsible individual must stay with you for 24 hours following the procedure.  For the next 24 hours, DO NOT: -Drive a car -Paediatric nurse -Drink alcoholic beverages -Take any medication unless instructed by your physician -Make any legal decisions or sign important papers.  Meals: Start with liquid foods such as gelatin or soup. Progress to regular foods as tolerated. Avoid greasy, spicy, heavy foods. If nausea and/or vomiting occur, drink only clear liquids until the nausea and/or vomiting subsides. Call your physician if vomiting continues.  Special Instructions/Symptoms: Your throat may feel dry or sore from the anesthesia or  the breathing tube placed in your throat during surgery. If this causes discomfort, gargle with warm salt water. The discomfort should disappear within 24 hours.  If you had a scopolamine patch placed behind your ear for the management of post- operative nausea and/or vomiting:  1. The medication in the patch is effective for 72 hours, after which it should be removed.  Wrap patch in a tissue and discard in the trash. Wash hands thoroughly with soap and water. 2. You may remove the patch earlier than 72 hours if you experience unpleasant side effects which may include dry mouth, dizziness or visual disturbances. 3. Avoid touching the patch. Wash your hands with soap and water after contact with the patch.      Regional Anesthesia Blocks  1. Numbness or the inability to move the "blocked" extremity may last from 3-48 hours after placement. The length of time depends on the medication injected and your individual response to the medication. If the numbness is not going away after 48 hours, call your surgeon.  2. The extremity that is blocked will need to be protected until the numbness is gone and the  Strength has returned. Because you cannot feel it, you will need to take extra care to avoid injury. Because it may be weak, you may have difficulty moving it or using it. You may not know what position it is in without looking at it while the block is in effect.  3. For blocks in the legs and feet, returning to weight  bearing and walking needs to be done carefully. You will need to wait until the numbness is entirely gone and the strength has returned. You should be able to move your leg and foot normally before you try and bear weight or walk. You will need someone to be with you when you first try to ensure you do not fall and possibly risk injury.  4. Bruising and tenderness at the needle site are common side effects and will resolve in a few days.  5. Persistent numbness or new problems with  movement should be communicated to the surgeon or the [place] [phone] [place] [phone]).   Information for Discharge Teaching: EXPAREL (bupivacaine liposome injectable suspension)   Your surgeon or anesthesiologist gave you EXPAREL(bupivacaine) to help control your pain after surgery.   EXPAREL is a local anesthetic that provides pain relief by numbing the tissue around the surgical site.  EXPAREL is designed to release pain medication over time and can control pain for up to 72 hours.  Depending on how you respond to EXPAREL, you may require less pain medication during your recovery.  Possible side effects:  Temporary loss of sensation or ability to move in the area where bupivacaine was injected.  Nausea, vomiting, constipation  Rarely, numbness and tingling in your mouth or lips, lightheadedness, or anxiety may occur.  Call your doctor right away if you think you may be experiencing any of these sensations, or if you have other questions regarding possible side effects.  Follow all other discharge instructions given to you by your surgeon or nurse. Eat a healthy diet and drink plenty of water or other fluids.  If you return to the hospital for any reason within 96 hours following the administration of EXPAREL, it is important for health care providers to know that you have received this anesthetic. A teal colored band has been placed on your arm with the date, time and amount of EXPAREL you have received in order to alert and inform your health care providers. Please leave this armband in place for the full 96 hours following administration, and then you may remove the band.

## 2018-10-29 NOTE — Transfer of Care (Signed)
Immediate Anesthesia Transfer of Care Note  Patient: Linda Mcdonald  Procedure(s) Performed: SHOULDER ARTHROSCOPY WITH OPEN ROTATOR CUFF REPAIR (Right Shoulder)  Patient Location: PACU  Anesthesia Type:General and Regional  Level of Consciousness: awake  Airway & Oxygen Therapy: Patient Spontanous Breathing and Patient connected to nasal cannula oxygen  Post-op Assessment: Report given to RN and Post -op Vital signs reviewed and stable  Post vital signs: Reviewed and stable  Last Vitals:  Vitals Value Taken Time  BP 153/74 10/29/18 1417  Temp    Pulse 88 10/29/18 1417  Resp 23 10/29/18 1417  SpO2 100 % 10/29/18 1417  Vitals shown include unvalidated device data.  Last Pain:  Vitals:   10/29/18 1032  TempSrc: Oral  PainSc: 10-Worst pain ever      Patients Stated Pain Goal: 5 (73/22/02 5427)  Complications: No apparent anesthesia complications

## 2018-10-29 NOTE — H&P (Signed)
  Leanord Asal  Physician Assistant Certified  Orthopedics  H&P  Signed  Encounter Date:  10/28/2018          Signed      Expand All Collapse All    Show:Clear all [x] Manual[x] Template[x] Copied  Added by: [x] Ramsie Ostrander, Vonna Kotyk, PA-C  [] Hover for details Linda Mcdonald is an 68 y.o. female.   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  She does have past medical history of hypertension.  Family history of diabetes mellitus, hypertension and coronary artery disease.  Medications include HCTZ, gabapentin, omeprazole, Meloxicam, Esterase, over-the-counter vitamins.  Scan findings include a full thickness tear, near full width.  AC arthritis, mild.  Moderate degenerative changes of the shoulder.  She also has cervical spine issues with some pathology on the right side that could be affecting her parascapular area, as well as pathology affecting the left arm, which is not related to her complaints regarding the right shoulder.    No past medical history on file.    No family history on file. Social History:  has no history on file for tobacco, alcohol, and drug.  Allergies: Not on File  (Not in a hospital admission)   Lab Results Last 48 Hours  No results found for this or any previous visit (from the past 48 hour(s)).   Imaging Results (Last 48 hours)  No results found.    Review of Systems  Musculoskeletal: Positive for joint pain.  All other systems reviewed and are negative.   There were no vitals taken for this visit. Physical Exam  Constitutional: She is oriented to person, place, and time. She appears well-developed and well-nourished. No distress.  HENT:  Head: Normocephalic and atraumatic.  Eyes: Pupils are equal, round, and reactive to light. Conjunctivae and EOM are normal.  Neck: Normal range of motion. Neck supple.  Cardiovascular: Normal rate and intact distal pulses.   Respiratory: Effort normal. No respiratory distress.  Musculoskeletal:     Right shoulder: She exhibits decreased range of motion, tenderness, swelling, pain and decreased strength.  Neurological: She is alert and oriented to person, place, and time.  Skin: Skin is warm and dry. No rash noted. No erythema.  Psychiatric: She has a normal mood and affect. Her behavior is normal.     Assessment/Plan I am concerned about her shoulder and the potential that a cuff tear may become irreparable.  There is some chance it would be irreparable now, but hopefully not.  I recommend arthroscopy, acromioplasty, distal clavicle, rotator cuff repair right.  She understands that there is a 1 out of 4 chance that this will not be repairable.  The alternative at this point would be a debridement without reconstruction, which would not be optimal, and a potential reverse shoulder arthroplasty, which would be too aggressive.  She understands and may come to that in the future.  Risks and benefits are discussed in detail.  We will proceed on with scheduling as an outpatient with general anesthetic and nerve block.    Chriss Czar, PA-C 10/28/2018, 5:46 PM          Cosigned by: Earlie Server, MD at 10/29/2018 8:44 AM  Electronically signed by Chriss Czar, PA-C at 10/28/2018 5:56 PM  Electronically signed by Earlie Server, MD at 10/29/2018 8:44 AM   Pre-op/Pre-procedure Orders on 10/28/2018     Revision & Routing History     Detailed Report

## 2018-10-29 NOTE — Progress Notes (Signed)
Assisted Dr. Ossey with right, ultrasound guided, interscalene  block. Side rails up, monitors on throughout procedure. See vital signs in flow sheet. Tolerated Procedure well. 

## 2018-10-29 NOTE — Anesthesia Procedure Notes (Signed)
Procedure Name: Intubation Date/Time: 10/29/2018 12:35 PM Performed by: Maryella Shivers, CRNA Pre-anesthesia Checklist: Patient identified, Emergency Drugs available, Suction available and Patient being monitored Patient Re-evaluated:Patient Re-evaluated prior to induction Oxygen Delivery Method: Circle system utilized Preoxygenation: Pre-oxygenation with 100% oxygen Induction Type: IV induction Ventilation: Mask ventilation without difficulty Laryngoscope Size: Mac Grade View: Grade IV Tube type: Oral Tube size: 7.0 mm Number of attempts: 3 Airway Equipment and Method: Stylet,  Oral airway and Video-laryngoscopy Placement Confirmation: ETT inserted through vocal cords under direct vision,  positive ETCO2 and breath sounds checked- equal and bilateral Secured at: 20 cm Tube secured with: Tape Dental Injury: Teeth and Oropharynx as per pre-operative assessment  Difficulty Due To: Difficult Airway- due to anterior larynx Comments: Successful intubation, easy view with Glidescope, Easy FMV between attempts

## 2018-10-29 NOTE — Anesthesia Postprocedure Evaluation (Signed)
Anesthesia Post Note  Patient: Linda Mcdonald  Procedure(s) Performed: SHOULDER ARTHROSCOPY WITH OPEN ROTATOR CUFF REPAIR (Right Shoulder) SHOULDER ARTHROSCOPY WITH SUBACROMIAL DECOMPRESSION (Right Shoulder)     Patient location during evaluation: PACU Anesthesia Type: General Level of consciousness: awake and alert Pain management: pain level controlled Vital Signs Assessment: post-procedure vital signs reviewed and stable Respiratory status: spontaneous breathing, nonlabored ventilation, respiratory function stable and patient connected to nasal cannula oxygen Cardiovascular status: blood pressure returned to baseline and stable Postop Assessment: no apparent nausea or vomiting Anesthetic complications: no    Last Vitals:  Vitals:   10/29/18 1430 10/29/18 1445  BP: (!) 157/72 (!) 154/71  Pulse: 79 75  Resp: 17 17  Temp:    SpO2: 100% 100%    Last Pain:  Vitals:   10/29/18 1445  TempSrc:   PainSc: 0-No pain                 Chante Mayson DAVID

## 2018-10-29 NOTE — Anesthesia Preprocedure Evaluation (Signed)
Anesthesia Evaluation  Patient identified by MRN, date of birth, ID band Patient awake    Reviewed: Allergy & Precautions, NPO status , Patient's Chart, lab work & pertinent test results  Airway Mallampati: I  TM Distance: >3 FB Neck ROM: Full    Dental   Pulmonary    Pulmonary exam normal        Cardiovascular hypertension, Pt. on medications Normal cardiovascular exam     Neuro/Psych    GI/Hepatic GERD  Medicated and Controlled,  Endo/Other    Renal/GU      Musculoskeletal   Abdominal   Peds  Hematology   Anesthesia Other Findings   Reproductive/Obstetrics                             Anesthesia Physical Anesthesia Plan  ASA: II  Anesthesia Plan: General   Post-op Pain Management:  Regional for Post-op pain   Induction: Intravenous  PONV Risk Score and Plan: 3 and Midazolam, Dexamethasone and Ondansetron  Airway Management Planned: Oral ETT  Additional Equipment:   Intra-op Plan:   Post-operative Plan: Extubation in OR  Informed Consent: I have reviewed the patients History and Physical, chart, labs and discussed the procedure including the risks, benefits and alternatives for the proposed anesthesia with the patient or authorized representative who has indicated his/her understanding and acceptance.       Plan Discussed with: CRNA and Surgeon  Anesthesia Plan Comments:         Anesthesia Quick Evaluation

## 2018-10-29 NOTE — Op Note (Signed)
Linda Mcdonald, Linda Mcdonald MEDICAL RECORD HW:[id number] ACCOUNT [id number] DATE OF BIRTH:Sep 28, 1950 FACILITY: [facility] LOCATION: [facility] PHYSICIAN:W. Ireta Pullman JR., MD  OPERATIVE REPORT  DATE OF PROCEDURE:  10/29/2018  PREOPERATIVE DIAGNOSES: 1.  Massive rotator cuff tear, right shoulder. 2.  Early rotator cuff tear arthropathy.   3.  Impingement. 4.  Acromioclavicular joint arthritis.  POSTOPERATIVE DIAGNOSES:   1.  Massive rotator cuff tear, right shoulder. 2.  Early rotator cuff tear arthropathy.   3.  Impingement. 4.  Acromioclavicular joint arthritis.  OPERATION: 1.  Arthroscopic debridement (extensive). 2.  Open rotator cuff repair acromioplasty. 3.  Open distal clavicle all for the right shoulder.  SURGEON:  Vangie Bicker, MD  ASSISTANTMarjo Bicker.  ANESTHESIA:  General with a block.  DESCRIPTION OF PROCEDURE:  Beach chair positioning.  Posterior lateral and anterior portals created.  The patient had certainly significantly more glenohumeral wear and more severe tear than the MRI indicated specifically a complete tear of the  supraspinatus with involvement of the infraspinatus with a large retracted tear, grade III glenohumeral arthritis was noted in all areas of the joint as well.  Biceps tendon showed a chronic rupture significant amount of articular debris as well as  remnants of the biceps were debrided.  There was no grade IV changes; however, noted.  Massive retracted tear of the supraspinatus was noted.  Retraction halfway back towards the glenoid.  It did mobilize some.  My thought based on this was an indication  to consider repair.  After arthroscopic debridement, incision was made bisecting the acromial AC interval.  Severe AC arthritis was noted.  We excised about a centimeter of the distal clavicle.  Very prominent type 3 acromion was noted and resected  about 8-9 mm of the leading edge of the acromion.  Significant impingement was noted.  We mobilized the  cuff best as we could.  Again, we could probably get it about 3/4 back to its normal attachment site.  We prepared a bony bed.  We did a medial  lateral row technique using a double-loaded 4.75 mm SwiveLock and then placed this into the medial row and then a lateral row similarly to do a medial lateral row technique.  We oversewed one edge with the FiberWire on the posterior leaflet and on the  anterior leaflet as well.  Again, we could not get a watertight repair certainly but given the severity of her cuff pathology, I thought that we did reasonably well with getting some rotator cuff back to near its anatomic attachment site.  Wound was  irrigated.  Closure was affected with #1 Ti-Cron on the split deltoid.  Subcutaneous tissues with 2-0 Vicryl and skin with Monocryl.  A lightly compressive sterile dressing and a many pillow splint applied.  Taken to recovery room in stable condition.  TN/NUANCE  D:10/29/2018 T:10/29/2018 JOB:[id number]

## 2018-10-29 NOTE — Anesthesia Procedure Notes (Signed)
Anesthesia Regional Block: Interscalene brachial plexus block   Pre-Anesthetic Checklist: ,, timeout performed, Correct Patient, Correct Site, Correct Laterality, Correct Procedure, Correct Position, site marked, Risks and benefits discussed,  Surgical consent,  Pre-op evaluation,  At surgeon's request and post-op pain management  Laterality: Right  Prep: chloraprep       Needles:  Injection technique: Single-shot  Needle Type: Echogenic Stimulator Needle     Needle Length: 5cm  Needle Gauge: 21     Additional Needles:   Procedures:, nerve stimulator,,,,,,,   Nerve Stimulator or Paresthesia:  Response: 0.4 mA,   Additional Responses:   Narrative:  Start time: 10/29/2018 11:07 AM End time: 10/29/2018 11:17 AM Injection made incrementally with aspirations every 5 mL.  Additional Notes: Monitors applied. Patient sedated. Sterile prep and drape,hand hygiene and sterile gloves were used. Relevant anatomy identified.Needle position confirmed.Local anesthetic injected incrementally after negative aspiration. Local anesthetic spread visualized around nerve(s). Vascular puncture avoided. No complications. Image printed for medical record.The patient tolerated the procedure well.

## 2018-10-29 NOTE — Interval H&P Note (Signed)
History and Physical Interval Note:  10/29/2018 12:13 PM  Linda Mcdonald  has presented today for surgery, with the diagnosis of Ava.  The various methods of treatment have been discussed with the patient and family. After consideration of risks, benefits and other options for treatment, the patient has consented to  Procedure(s) with comments: SHOULDER ARTHROSCOPY WITH OPEN ROTATOR CUFF REPAIR (Right) - PRE POST SCALENE BLOCK as a surgical intervention.  The patient's history has been reviewed, patient examined, no change in status, stable for surgery.  I have reviewed the patient's chart and labs.  Questions were answered to the patient's satisfaction.     Yvette Rack

## 2018-10-30 ENCOUNTER — Encounter (HOSPITAL_BASED_OUTPATIENT_CLINIC_OR_DEPARTMENT_OTHER): Payer: Self-pay | Admitting: Orthopedic Surgery

## 2018-11-04 DIAGNOSIS — M19011 Primary osteoarthritis, right shoulder: Secondary | ICD-10-CM | POA: Diagnosis not present

## 2018-11-25 DIAGNOSIS — M19011 Primary osteoarthritis, right shoulder: Secondary | ICD-10-CM | POA: Diagnosis not present

## 2018-12-01 DIAGNOSIS — I10 Essential (primary) hypertension: Secondary | ICD-10-CM | POA: Diagnosis not present

## 2018-12-01 DIAGNOSIS — M25611 Stiffness of right shoulder, not elsewhere classified: Secondary | ICD-10-CM | POA: Diagnosis not present

## 2018-12-01 DIAGNOSIS — M25511 Pain in right shoulder: Secondary | ICD-10-CM | POA: Diagnosis not present

## 2018-12-03 DIAGNOSIS — M25511 Pain in right shoulder: Secondary | ICD-10-CM | POA: Diagnosis not present

## 2018-12-03 DIAGNOSIS — I10 Essential (primary) hypertension: Secondary | ICD-10-CM | POA: Diagnosis not present

## 2018-12-03 DIAGNOSIS — M25611 Stiffness of right shoulder, not elsewhere classified: Secondary | ICD-10-CM | POA: Diagnosis not present

## 2018-12-08 DIAGNOSIS — H524 Presbyopia: Secondary | ICD-10-CM | POA: Diagnosis not present

## 2018-12-08 DIAGNOSIS — D3131 Benign neoplasm of right choroid: Secondary | ICD-10-CM | POA: Diagnosis not present

## 2018-12-08 DIAGNOSIS — H2513 Age-related nuclear cataract, bilateral: Secondary | ICD-10-CM | POA: Diagnosis not present

## 2018-12-09 DIAGNOSIS — M25511 Pain in right shoulder: Secondary | ICD-10-CM | POA: Diagnosis not present

## 2018-12-09 DIAGNOSIS — M25611 Stiffness of right shoulder, not elsewhere classified: Secondary | ICD-10-CM | POA: Diagnosis not present

## 2018-12-11 DIAGNOSIS — M25611 Stiffness of right shoulder, not elsewhere classified: Secondary | ICD-10-CM | POA: Diagnosis not present

## 2018-12-11 DIAGNOSIS — M25511 Pain in right shoulder: Secondary | ICD-10-CM | POA: Diagnosis not present

## 2018-12-15 DIAGNOSIS — M25611 Stiffness of right shoulder, not elsewhere classified: Secondary | ICD-10-CM | POA: Diagnosis not present

## 2018-12-15 DIAGNOSIS — M25511 Pain in right shoulder: Secondary | ICD-10-CM | POA: Diagnosis not present

## 2018-12-17 DIAGNOSIS — M25611 Stiffness of right shoulder, not elsewhere classified: Secondary | ICD-10-CM | POA: Diagnosis not present

## 2018-12-17 DIAGNOSIS — M25511 Pain in right shoulder: Secondary | ICD-10-CM | POA: Diagnosis not present

## 2018-12-18 DIAGNOSIS — Z1231 Encounter for screening mammogram for malignant neoplasm of breast: Secondary | ICD-10-CM | POA: Diagnosis not present

## 2018-12-18 DIAGNOSIS — N958 Other specified menopausal and perimenopausal disorders: Secondary | ICD-10-CM | POA: Diagnosis not present

## 2018-12-18 DIAGNOSIS — M810 Age-related osteoporosis without current pathological fracture: Secondary | ICD-10-CM | POA: Diagnosis not present

## 2018-12-19 DIAGNOSIS — D539 Nutritional anemia, unspecified: Secondary | ICD-10-CM | POA: Diagnosis not present

## 2018-12-19 DIAGNOSIS — R5383 Other fatigue: Secondary | ICD-10-CM | POA: Diagnosis not present

## 2018-12-19 DIAGNOSIS — Z6831 Body mass index (BMI) 31.0-31.9, adult: Secondary | ICD-10-CM | POA: Diagnosis not present

## 2018-12-22 DIAGNOSIS — M25511 Pain in right shoulder: Secondary | ICD-10-CM | POA: Diagnosis not present

## 2018-12-22 DIAGNOSIS — M25611 Stiffness of right shoulder, not elsewhere classified: Secondary | ICD-10-CM | POA: Diagnosis not present

## 2018-12-23 DIAGNOSIS — M19011 Primary osteoarthritis, right shoulder: Secondary | ICD-10-CM | POA: Diagnosis not present

## 2018-12-24 DIAGNOSIS — M25611 Stiffness of right shoulder, not elsewhere classified: Secondary | ICD-10-CM | POA: Diagnosis not present

## 2018-12-24 DIAGNOSIS — M25511 Pain in right shoulder: Secondary | ICD-10-CM | POA: Diagnosis not present

## 2018-12-30 DIAGNOSIS — M810 Age-related osteoporosis without current pathological fracture: Secondary | ICD-10-CM | POA: Insufficient documentation

## 2018-12-31 DIAGNOSIS — I10 Essential (primary) hypertension: Secondary | ICD-10-CM | POA: Diagnosis not present

## 2018-12-31 DIAGNOSIS — M545 Low back pain: Secondary | ICD-10-CM | POA: Diagnosis not present

## 2018-12-31 DIAGNOSIS — M75101 Unspecified rotator cuff tear or rupture of right shoulder, not specified as traumatic: Secondary | ICD-10-CM | POA: Diagnosis not present

## 2018-12-31 DIAGNOSIS — M5136 Other intervertebral disc degeneration, lumbar region: Secondary | ICD-10-CM | POA: Diagnosis not present

## 2018-12-31 DIAGNOSIS — Z6829 Body mass index (BMI) 29.0-29.9, adult: Secondary | ICD-10-CM | POA: Diagnosis not present

## 2018-12-31 DIAGNOSIS — M7918 Myalgia, other site: Secondary | ICD-10-CM | POA: Diagnosis not present

## 2019-01-01 DIAGNOSIS — M25611 Stiffness of right shoulder, not elsewhere classified: Secondary | ICD-10-CM | POA: Diagnosis not present

## 2019-01-01 DIAGNOSIS — M25511 Pain in right shoulder: Secondary | ICD-10-CM | POA: Diagnosis not present

## 2019-01-02 DIAGNOSIS — M810 Age-related osteoporosis without current pathological fracture: Secondary | ICD-10-CM | POA: Diagnosis not present

## 2019-01-05 DIAGNOSIS — M25611 Stiffness of right shoulder, not elsewhere classified: Secondary | ICD-10-CM | POA: Diagnosis not present

## 2019-01-05 DIAGNOSIS — M25511 Pain in right shoulder: Secondary | ICD-10-CM | POA: Diagnosis not present

## 2019-01-07 DIAGNOSIS — M25611 Stiffness of right shoulder, not elsewhere classified: Secondary | ICD-10-CM | POA: Diagnosis not present

## 2019-01-07 DIAGNOSIS — M25511 Pain in right shoulder: Secondary | ICD-10-CM | POA: Diagnosis not present

## 2019-01-12 DIAGNOSIS — M25611 Stiffness of right shoulder, not elsewhere classified: Secondary | ICD-10-CM | POA: Diagnosis not present

## 2019-01-12 DIAGNOSIS — M25511 Pain in right shoulder: Secondary | ICD-10-CM | POA: Diagnosis not present

## 2019-01-14 DIAGNOSIS — M25611 Stiffness of right shoulder, not elsewhere classified: Secondary | ICD-10-CM | POA: Diagnosis not present

## 2019-01-14 DIAGNOSIS — M25511 Pain in right shoulder: Secondary | ICD-10-CM | POA: Diagnosis not present

## 2019-01-20 DIAGNOSIS — M25511 Pain in right shoulder: Secondary | ICD-10-CM | POA: Diagnosis not present

## 2019-01-20 DIAGNOSIS — M25611 Stiffness of right shoulder, not elsewhere classified: Secondary | ICD-10-CM | POA: Diagnosis not present

## 2019-01-22 DIAGNOSIS — M25511 Pain in right shoulder: Secondary | ICD-10-CM | POA: Diagnosis not present

## 2019-01-22 DIAGNOSIS — M25611 Stiffness of right shoulder, not elsewhere classified: Secondary | ICD-10-CM | POA: Diagnosis not present

## 2019-01-28 DIAGNOSIS — M25511 Pain in right shoulder: Secondary | ICD-10-CM | POA: Diagnosis not present

## 2019-01-28 DIAGNOSIS — M25611 Stiffness of right shoulder, not elsewhere classified: Secondary | ICD-10-CM | POA: Diagnosis not present

## 2019-02-04 DIAGNOSIS — H6592 Unspecified nonsuppurative otitis media, left ear: Secondary | ICD-10-CM | POA: Diagnosis not present

## 2019-02-04 DIAGNOSIS — D539 Nutritional anemia, unspecified: Secondary | ICD-10-CM | POA: Diagnosis not present

## 2019-02-04 DIAGNOSIS — R42 Dizziness and giddiness: Secondary | ICD-10-CM | POA: Diagnosis not present

## 2019-02-04 DIAGNOSIS — Z79899 Other long term (current) drug therapy: Secondary | ICD-10-CM | POA: Diagnosis not present

## 2019-02-06 DIAGNOSIS — M75101 Unspecified rotator cuff tear or rupture of right shoulder, not specified as traumatic: Secondary | ICD-10-CM | POA: Diagnosis not present

## 2019-02-09 DIAGNOSIS — M25511 Pain in right shoulder: Secondary | ICD-10-CM | POA: Diagnosis not present

## 2019-02-09 DIAGNOSIS — M25611 Stiffness of right shoulder, not elsewhere classified: Secondary | ICD-10-CM | POA: Diagnosis not present

## 2019-02-12 DIAGNOSIS — M19011 Primary osteoarthritis, right shoulder: Secondary | ICD-10-CM | POA: Diagnosis not present

## 2019-02-18 DIAGNOSIS — M25611 Stiffness of right shoulder, not elsewhere classified: Secondary | ICD-10-CM | POA: Diagnosis not present

## 2019-02-18 DIAGNOSIS — M25511 Pain in right shoulder: Secondary | ICD-10-CM | POA: Diagnosis not present

## 2019-02-23 DIAGNOSIS — M25611 Stiffness of right shoulder, not elsewhere classified: Secondary | ICD-10-CM | POA: Diagnosis not present

## 2019-02-23 DIAGNOSIS — M25511 Pain in right shoulder: Secondary | ICD-10-CM | POA: Diagnosis not present

## 2019-02-27 DIAGNOSIS — M75101 Unspecified rotator cuff tear or rupture of right shoulder, not specified as traumatic: Secondary | ICD-10-CM | POA: Diagnosis not present

## 2019-03-06 DIAGNOSIS — E669 Obesity, unspecified: Secondary | ICD-10-CM | POA: Diagnosis not present

## 2019-03-06 DIAGNOSIS — Z23 Encounter for immunization: Secondary | ICD-10-CM | POA: Diagnosis not present

## 2019-03-06 DIAGNOSIS — M1990 Unspecified osteoarthritis, unspecified site: Secondary | ICD-10-CM | POA: Diagnosis not present

## 2019-03-06 DIAGNOSIS — I10 Essential (primary) hypertension: Secondary | ICD-10-CM | POA: Diagnosis not present

## 2019-03-06 DIAGNOSIS — E785 Hyperlipidemia, unspecified: Secondary | ICD-10-CM | POA: Diagnosis not present

## 2019-03-27 DIAGNOSIS — M75101 Unspecified rotator cuff tear or rupture of right shoulder, not specified as traumatic: Secondary | ICD-10-CM | POA: Diagnosis not present

## 2019-04-06 DIAGNOSIS — M461 Sacroiliitis, not elsewhere classified: Secondary | ICD-10-CM | POA: Diagnosis not present

## 2019-04-20 DIAGNOSIS — L820 Inflamed seborrheic keratosis: Secondary | ICD-10-CM | POA: Diagnosis not present

## 2019-04-20 DIAGNOSIS — L570 Actinic keratosis: Secondary | ICD-10-CM | POA: Diagnosis not present

## 2019-04-20 DIAGNOSIS — L821 Other seborrheic keratosis: Secondary | ICD-10-CM | POA: Diagnosis not present

## 2019-04-20 DIAGNOSIS — L578 Other skin changes due to chronic exposure to nonionizing radiation: Secondary | ICD-10-CM | POA: Diagnosis not present

## 2019-04-20 DIAGNOSIS — D2239 Melanocytic nevi of other parts of face: Secondary | ICD-10-CM | POA: Diagnosis not present

## 2019-04-24 DIAGNOSIS — Z6829 Body mass index (BMI) 29.0-29.9, adult: Secondary | ICD-10-CM | POA: Diagnosis not present

## 2019-04-24 DIAGNOSIS — M545 Low back pain: Secondary | ICD-10-CM | POA: Diagnosis not present

## 2019-04-24 DIAGNOSIS — I10 Essential (primary) hypertension: Secondary | ICD-10-CM | POA: Diagnosis not present

## 2019-04-24 DIAGNOSIS — M461 Sacroiliitis, not elsewhere classified: Secondary | ICD-10-CM | POA: Diagnosis not present

## 2019-04-27 ENCOUNTER — Other Ambulatory Visit: Payer: Self-pay | Admitting: Orthopaedic Surgery

## 2019-04-27 ENCOUNTER — Telehealth: Payer: Self-pay

## 2019-04-27 DIAGNOSIS — M461 Sacroiliitis, not elsewhere classified: Secondary | ICD-10-CM

## 2019-04-27 NOTE — Telephone Encounter (Signed)
Phone call to patient to verify medication list and allergies for myelogram procedure. Medications pt is currently taking are safe to continue to take. Advised pt if any new medications are started prior to procedure to call and make [place] aware. Pt also instructed to have a driver the day of the procedure, she would be with [place] around 2 hours, and discharge instructions reviewed including the need to lay flat for 24 hours after procedure. Pt verbalized understanding.

## 2019-05-01 ENCOUNTER — Other Ambulatory Visit: Payer: Self-pay

## 2019-05-01 ENCOUNTER — Ambulatory Visit
Admission: RE | Admit: 2019-05-01 | Discharge: 2019-05-01 | Disposition: A | Discharge status: Home / Self Care | Payer: Medicare Other | Source: Ambulatory Visit | Attending: Orthopaedic Surgery | Admitting: Orthopaedic Surgery

## 2019-05-01 DIAGNOSIS — M461 Sacroiliitis, not elsewhere classified: Secondary | ICD-10-CM

## 2019-05-01 MED ORDER — ONDANSETRON HCL 4 MG/2ML IJ SOLN: 4.0000 mg | Freq: Four times a day (QID) | INTRAMUSCULAR | Status: DC | PRN

## 2019-05-01 MED ORDER — DIAZEPAM 5 MG PO TABS
5.0000 mg | ORAL_TABLET | Freq: Once | ORAL | Status: AC
  Administered 2019-05-01: 13:00:00 5 mg via ORAL

## 2019-05-01 MED ORDER — IOPAMIDOL (ISOVUE-M 200) INJECTION 41%: 15.0000 mL | Freq: Once | INTRAMUSCULAR | Status: DC

## 2019-05-01 NOTE — Discharge Instructions (Signed)

## 2019-06-05 DIAGNOSIS — M5416 Radiculopathy, lumbar region: Secondary | ICD-10-CM | POA: Diagnosis not present

## 2019-06-05 DIAGNOSIS — M4716 Other spondylosis with myelopathy, lumbar region: Secondary | ICD-10-CM | POA: Diagnosis not present

## 2019-06-05 DIAGNOSIS — G894 Chronic pain syndrome: Secondary | ICD-10-CM | POA: Diagnosis not present

## 2019-06-05 DIAGNOSIS — Z79899 Other long term (current) drug therapy: Secondary | ICD-10-CM | POA: Diagnosis not present

## 2019-06-05 DIAGNOSIS — M4316 Spondylolisthesis, lumbar region: Secondary | ICD-10-CM | POA: Diagnosis not present

## 2019-06-05 DIAGNOSIS — Z79891 Long term (current) use of opiate analgesic: Secondary | ICD-10-CM | POA: Diagnosis not present

## 2019-06-09 DIAGNOSIS — M4716 Other spondylosis with myelopathy, lumbar region: Secondary | ICD-10-CM | POA: Diagnosis not present

## 2019-06-09 DIAGNOSIS — Z20822 Contact with and (suspected) exposure to covid-19: Secondary | ICD-10-CM | POA: Diagnosis not present

## 2019-06-09 DIAGNOSIS — Z01818 Encounter for other preprocedural examination: Secondary | ICD-10-CM | POA: Diagnosis not present

## 2019-06-16 DIAGNOSIS — M4326 Fusion of spine, lumbar region: Secondary | ICD-10-CM | POA: Diagnosis not present

## 2019-06-16 DIAGNOSIS — Z981 Arthrodesis status: Secondary | ICD-10-CM | POA: Diagnosis not present

## 2019-06-16 DIAGNOSIS — M4716 Other spondylosis with myelopathy, lumbar region: Secondary | ICD-10-CM | POA: Diagnosis not present

## 2019-06-16 DIAGNOSIS — M4316 Spondylolisthesis, lumbar region: Secondary | ICD-10-CM | POA: Diagnosis not present

## 2019-06-16 DIAGNOSIS — M48062 Spinal stenosis, lumbar region with neurogenic claudication: Secondary | ICD-10-CM | POA: Diagnosis not present

## 2019-06-16 DIAGNOSIS — M5416 Radiculopathy, lumbar region: Secondary | ICD-10-CM | POA: Diagnosis not present

## 2019-06-16 DIAGNOSIS — M961 Postlaminectomy syndrome, not elsewhere classified: Secondary | ICD-10-CM | POA: Diagnosis not present

## 2019-06-16 DIAGNOSIS — M4726 Other spondylosis with radiculopathy, lumbar region: Secondary | ICD-10-CM | POA: Diagnosis not present

## 2019-06-17 DIAGNOSIS — M4326 Fusion of spine, lumbar region: Secondary | ICD-10-CM | POA: Diagnosis not present

## 2019-06-17 DIAGNOSIS — M48062 Spinal stenosis, lumbar region with neurogenic claudication: Secondary | ICD-10-CM | POA: Diagnosis not present

## 2019-06-17 DIAGNOSIS — M4726 Other spondylosis with radiculopathy, lumbar region: Secondary | ICD-10-CM | POA: Diagnosis not present

## 2019-06-17 DIAGNOSIS — Z981 Arthrodesis status: Secondary | ICD-10-CM | POA: Diagnosis not present

## 2019-06-17 DIAGNOSIS — M4716 Other spondylosis with myelopathy, lumbar region: Secondary | ICD-10-CM | POA: Diagnosis not present

## 2019-07-07 DIAGNOSIS — Z9181 History of falling: Secondary | ICD-10-CM | POA: Diagnosis not present

## 2019-07-07 DIAGNOSIS — Z1331 Encounter for screening for depression: Secondary | ICD-10-CM | POA: Diagnosis not present

## 2019-07-07 DIAGNOSIS — E785 Hyperlipidemia, unspecified: Secondary | ICD-10-CM | POA: Diagnosis not present

## 2019-07-07 DIAGNOSIS — Z0000 Encounter for general adult medical examination without abnormal findings: Secondary | ICD-10-CM | POA: Diagnosis not present

## 2019-07-07 DIAGNOSIS — Z6830 Body mass index (BMI) 30.0-30.9, adult: Secondary | ICD-10-CM | POA: Diagnosis not present

## 2019-07-14 DIAGNOSIS — M4326 Fusion of spine, lumbar region: Secondary | ICD-10-CM | POA: Diagnosis not present

## 2019-07-21 DIAGNOSIS — I10 Essential (primary) hypertension: Secondary | ICD-10-CM | POA: Diagnosis not present

## 2019-07-21 DIAGNOSIS — E785 Hyperlipidemia, unspecified: Secondary | ICD-10-CM | POA: Diagnosis not present

## 2019-07-21 DIAGNOSIS — G2581 Restless legs syndrome: Secondary | ICD-10-CM | POA: Diagnosis not present

## 2019-07-21 DIAGNOSIS — M1990 Unspecified osteoarthritis, unspecified site: Secondary | ICD-10-CM | POA: Diagnosis not present

## 2019-07-21 DIAGNOSIS — D539 Nutritional anemia, unspecified: Secondary | ICD-10-CM | POA: Diagnosis not present

## 2019-08-13 DIAGNOSIS — G2581 Restless legs syndrome: Secondary | ICD-10-CM | POA: Diagnosis not present

## 2019-09-14 DIAGNOSIS — G2581 Restless legs syndrome: Secondary | ICD-10-CM | POA: Diagnosis not present

## 2019-09-14 DIAGNOSIS — Z6831 Body mass index (BMI) 31.0-31.9, adult: Secondary | ICD-10-CM | POA: Diagnosis not present

## 2019-09-15 DIAGNOSIS — M25562 Pain in left knee: Secondary | ICD-10-CM | POA: Diagnosis not present

## 2019-09-15 DIAGNOSIS — M4326 Fusion of spine, lumbar region: Secondary | ICD-10-CM | POA: Diagnosis not present

## 2019-10-08 DIAGNOSIS — D485 Neoplasm of uncertain behavior of skin: Secondary | ICD-10-CM | POA: Diagnosis not present

## 2019-10-21 DIAGNOSIS — Z6831 Body mass index (BMI) 31.0-31.9, adult: Secondary | ICD-10-CM | POA: Diagnosis not present

## 2019-10-21 DIAGNOSIS — Z01419 Encounter for gynecological examination (general) (routine) without abnormal findings: Secondary | ICD-10-CM | POA: Diagnosis not present

## 2019-11-19 DIAGNOSIS — E010 Iodine-deficiency related diffuse (endemic) goiter: Secondary | ICD-10-CM | POA: Diagnosis not present

## 2019-12-16 DIAGNOSIS — I10 Essential (primary) hypertension: Secondary | ICD-10-CM | POA: Diagnosis not present

## 2019-12-16 DIAGNOSIS — M4326 Fusion of spine, lumbar region: Secondary | ICD-10-CM | POA: Diagnosis not present

## 2019-12-16 DIAGNOSIS — Z6829 Body mass index (BMI) 29.0-29.9, adult: Secondary | ICD-10-CM | POA: Diagnosis not present

## 2019-12-16 DIAGNOSIS — M25562 Pain in left knee: Secondary | ICD-10-CM | POA: Diagnosis not present

## 2019-12-16 DIAGNOSIS — M545 Low back pain: Secondary | ICD-10-CM | POA: Diagnosis not present

## 2019-12-21 DIAGNOSIS — Z1231 Encounter for screening mammogram for malignant neoplasm of breast: Secondary | ICD-10-CM | POA: Diagnosis not present

## 2019-12-23 ENCOUNTER — Other Ambulatory Visit: Payer: Self-pay | Admitting: Obstetrics and Gynecology

## 2019-12-23 DIAGNOSIS — R928 Other abnormal and inconclusive findings on diagnostic imaging of breast: Secondary | ICD-10-CM

## 2020-01-06 ENCOUNTER — Other Ambulatory Visit: Payer: Self-pay | Admitting: Obstetrics and Gynecology

## 2020-01-06 ENCOUNTER — Other Ambulatory Visit: Payer: Self-pay

## 2020-01-06 ENCOUNTER — Ambulatory Visit
Admission: RE | Admit: 2020-01-06 | Discharge: 2020-01-06 | Disposition: A | Discharge status: Home / Self Care | Payer: Medicare Other | Source: Ambulatory Visit | Attending: Obstetrics and Gynecology | Admitting: Obstetrics and Gynecology

## 2020-01-06 DIAGNOSIS — R921 Mammographic calcification found on diagnostic imaging of breast: Secondary | ICD-10-CM

## 2020-01-06 DIAGNOSIS — R928 Other abnormal and inconclusive findings on diagnostic imaging of breast: Secondary | ICD-10-CM

## 2020-01-14 ENCOUNTER — Other Ambulatory Visit: Payer: Self-pay

## 2020-01-14 ENCOUNTER — Ambulatory Visit
Admission: RE | Admit: 2020-01-14 | Discharge: 2020-01-14 | Disposition: A | Discharge status: Home / Self Care | Payer: Medicare Other | Source: Ambulatory Visit | Attending: Obstetrics and Gynecology | Admitting: Obstetrics and Gynecology

## 2020-01-14 DIAGNOSIS — R921 Mammographic calcification found on diagnostic imaging of breast: Secondary | ICD-10-CM

## 2020-01-14 DIAGNOSIS — N6489 Other specified disorders of breast: Secondary | ICD-10-CM | POA: Diagnosis not present

## 2020-03-10 DIAGNOSIS — R945 Abnormal results of liver function studies: Secondary | ICD-10-CM | POA: Diagnosis not present

## 2020-03-10 DIAGNOSIS — I10 Essential (primary) hypertension: Secondary | ICD-10-CM | POA: Diagnosis not present

## 2020-03-10 DIAGNOSIS — M48062 Spinal stenosis, lumbar region with neurogenic claudication: Secondary | ICD-10-CM | POA: Diagnosis not present

## 2020-03-10 DIAGNOSIS — M1990 Unspecified osteoarthritis, unspecified site: Secondary | ICD-10-CM | POA: Diagnosis not present

## 2020-03-10 DIAGNOSIS — D539 Nutritional anemia, unspecified: Secondary | ICD-10-CM | POA: Diagnosis not present

## 2020-03-10 DIAGNOSIS — Z23 Encounter for immunization: Secondary | ICD-10-CM | POA: Diagnosis not present

## 2020-03-10 DIAGNOSIS — E785 Hyperlipidemia, unspecified: Secondary | ICD-10-CM | POA: Diagnosis not present

## 2020-03-10 DIAGNOSIS — R490 Dysphonia: Secondary | ICD-10-CM | POA: Diagnosis not present

## 2020-03-10 DIAGNOSIS — E010 Iodine-deficiency related diffuse (endemic) goiter: Secondary | ICD-10-CM | POA: Diagnosis not present

## 2020-03-10 DIAGNOSIS — G2581 Restless legs syndrome: Secondary | ICD-10-CM | POA: Diagnosis not present

## 2020-03-10 DIAGNOSIS — Z139 Encounter for screening, unspecified: Secondary | ICD-10-CM | POA: Diagnosis not present

## 2020-03-10 DIAGNOSIS — E669 Obesity, unspecified: Secondary | ICD-10-CM | POA: Diagnosis not present

## 2020-03-11 DIAGNOSIS — L820 Inflamed seborrheic keratosis: Secondary | ICD-10-CM | POA: Diagnosis not present

## 2020-03-11 DIAGNOSIS — L219 Seborrheic dermatitis, unspecified: Secondary | ICD-10-CM | POA: Diagnosis not present

## 2020-03-11 DIAGNOSIS — L821 Other seborrheic keratosis: Secondary | ICD-10-CM | POA: Diagnosis not present

## 2020-03-11 DIAGNOSIS — L578 Other skin changes due to chronic exposure to nonionizing radiation: Secondary | ICD-10-CM | POA: Diagnosis not present

## 2020-03-11 DIAGNOSIS — D2239 Melanocytic nevi of other parts of face: Secondary | ICD-10-CM | POA: Diagnosis not present

## 2020-04-12 DIAGNOSIS — J343 Hypertrophy of nasal turbinates: Secondary | ICD-10-CM | POA: Diagnosis not present

## 2020-04-12 DIAGNOSIS — J387 Other diseases of larynx: Secondary | ICD-10-CM | POA: Diagnosis not present

## 2020-04-12 DIAGNOSIS — J342 Deviated nasal septum: Secondary | ICD-10-CM | POA: Diagnosis not present

## 2020-04-12 DIAGNOSIS — R490 Dysphonia: Secondary | ICD-10-CM | POA: Diagnosis not present

## 2020-06-15 DIAGNOSIS — M5136 Other intervertebral disc degeneration, lumbar region: Secondary | ICD-10-CM | POA: Diagnosis not present

## 2020-06-15 DIAGNOSIS — M4326 Fusion of spine, lumbar region: Secondary | ICD-10-CM | POA: Diagnosis not present

## 2020-06-15 DIAGNOSIS — M5416 Radiculopathy, lumbar region: Secondary | ICD-10-CM | POA: Diagnosis not present

## 2020-06-15 DIAGNOSIS — M4316 Spondylolisthesis, lumbar region: Secondary | ICD-10-CM | POA: Diagnosis not present

## 2020-06-21 DIAGNOSIS — M5126 Other intervertebral disc displacement, lumbar region: Secondary | ICD-10-CM | POA: Diagnosis not present

## 2020-06-21 DIAGNOSIS — M48061 Spinal stenosis, lumbar region without neurogenic claudication: Secondary | ICD-10-CM | POA: Diagnosis not present

## 2020-06-21 DIAGNOSIS — M4807 Spinal stenosis, lumbosacral region: Secondary | ICD-10-CM | POA: Diagnosis not present

## 2020-06-21 DIAGNOSIS — M47816 Spondylosis without myelopathy or radiculopathy, lumbar region: Secondary | ICD-10-CM | POA: Diagnosis not present

## 2020-07-04 IMAGING — CT CT L SPINE W/ CM
1 of 6 series · 6 of 14 positions shown, 8 images · non-contrast
Comparison: MRI 11/11/2017

CLINICAL DATA: Low back pain. Bilateral buttock pain. Pain radiates
to both lower extremities. Spondylosis without myelopathy.
TECHNIQUE: Contiguous axial images were obtained through the Lumbar spine after
the intrathecal infusion of infusion. Coronal and sagittal
reconstructions were obtained of the axial image sets.

[Series 3: l spine soft (person_name) · axial · 0.30mm/px · z∈[+283,+445]mm · 6 of 76 slices shown, 8 images]
[im 11/76  soft-tissue]
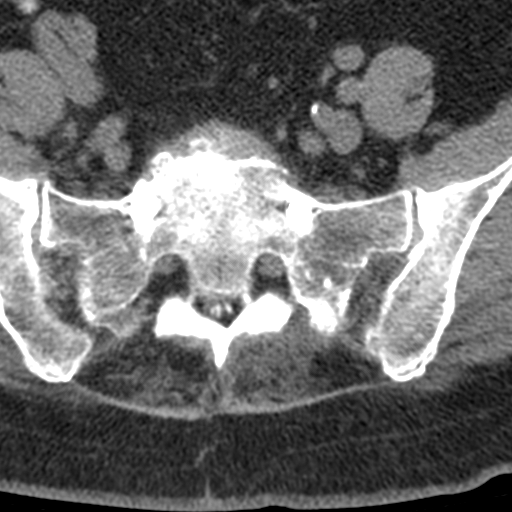
[im 11/76  bone]
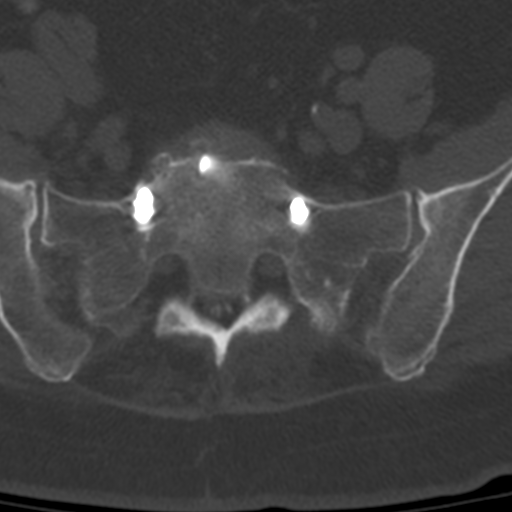
[im 22/76  bone]
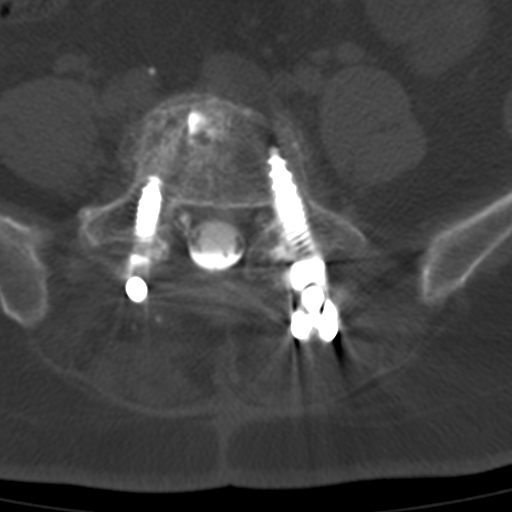
[im 33/76  bone]
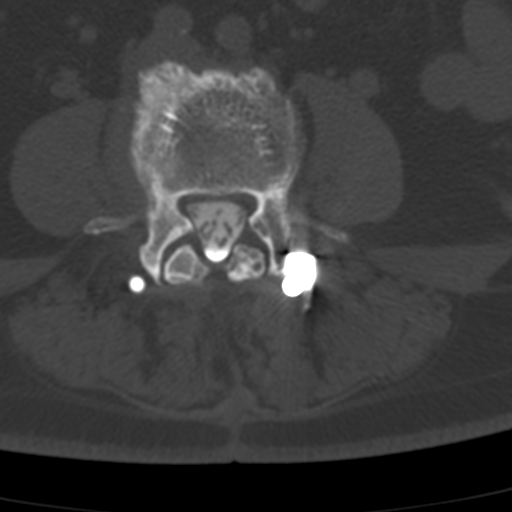
[im 43/76  bone]
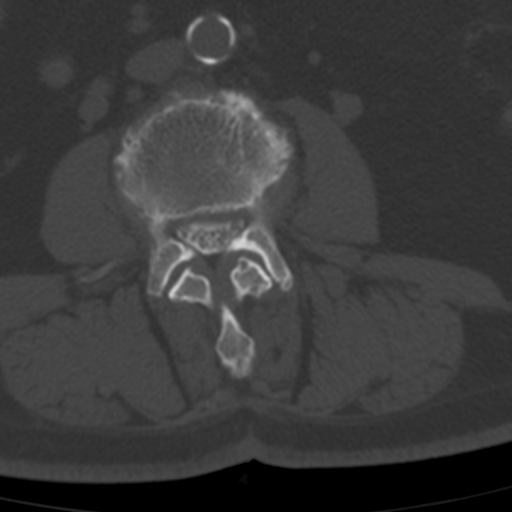
[im 54/76  soft-tissue]
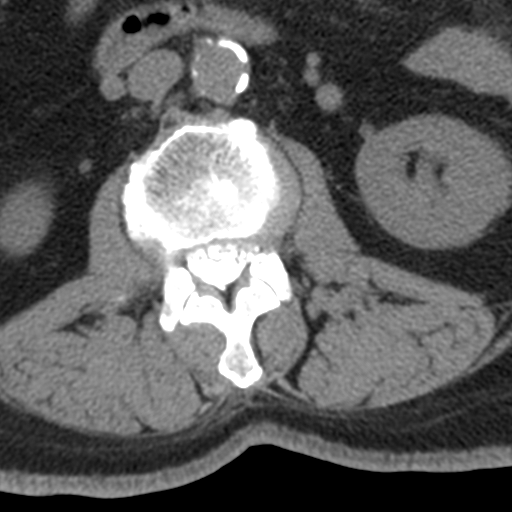
[im 54/76  bone]
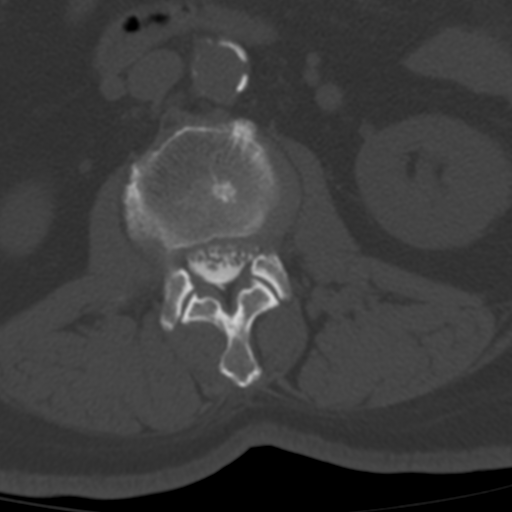
[im 65/76  bone]
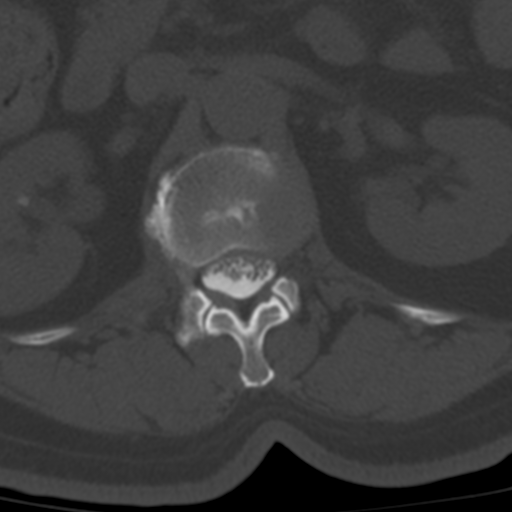

[6 of 14 positions shown; findings below may reference images not displayed]

EXAM:
LUMBAR MYELOGRAM

FLUOROSCOPY TIME:  1 minutes 0 seconds. 293.66 micro gray meter
squared

PROCEDURE:
After thorough discussion of risks and benefits of the procedure
including bleeding, infection, injury to nerves, blood vessels,
adjacent structures as well as headache and CSF leak, written and
oral informed consent was obtained. Consent was obtained by Dr. Matan
Boris. Time out form was completed.

Patient was positioned prone on the fluoroscopy table. Local
anesthesia was provided with 1% lidocaine without epinephrine after
prepped and draped in the usual sterile fashion. Puncture was
performed at L1-2 using a 3 1/2 inch 22-gauge spinal needle via
right paramedian approach. Using a single pass through the dura, the
needle was placed within the thecal sac, with return of clear CSF.
10 cc of Isovue [id number] was injected into the thecal sac, with normal
opacification of the nerve roots and cauda equina consistent with
free flow within the subarachnoid space.

I personally performed the lumbar puncture and administered the
intrathecal contrast. I also personally performed acquisition of the
myelogram images.
FINDINGS: LUMBAR MYELOGRAM FINDINGS:

There is been previous posterior decompression, diskectomy and
fusion surgery from L4 to the sacrum. There appears to be wide
patency throughout that region with no evidence of stenosis or
motion.

Anterior extradural defects are present at L1-2, L2-3 and L3-4
consistent with disc protrusions. There is stenosis of both lateral
recesses of a mild degree at L1-2, a moderate degree at L2-3 and a
severe degree at L3-4. There is abnormal motion at the L3-4 level
with rocking motion. 2 mm of retrolisthesis noted in the supine
position.

CT LUMBAR MYELOGRAM FINDINGS:

T12-L1: Mild bulging of the disc.  No compressive stenosis.

L1-2: Moderate bulging of the disc. Mild narrowing of the lateral
recesses but no visible neural compression.

L2-3: Shallow protrusion of the disc slightly more prominent towards
the left. Narrowing of the lateral recesses left more than right. No
definite neural compression.

L3-4: Advanced disc degeneration with vacuum phenomenon. 2 mm
retrolisthesis. Circumferential protrusion of the disc more
prominent towards the left, possibly with some disc material in the
left lateral recess. Bilateral facet arthropathy. Bilateral
foraminal compromise. Findings at this level are quite likely
symptomatic.

L4 to sacrum: Previous posterior decompression, diskectomy and
fusion procedure. Definite solid union at L4-5. At L5-S1, there is
no convincing evidence of screw motion. Minimal subsidence of the
interbody spacer. No convincing evidence of nonunion/ongoing motion.

Both sacroiliac joints show mild osteoarthritis.
IMPRESSION: Previous posterior decompression, diskectomy and fusion surgery from
L4 to the sacrum. Overall satisfactory appearance. See above.

The significant findings are probably at the L3-4 level. Advanced
disc degeneration with loss of disc height and vacuum phenomenon.
Broad-based herniation of disc material more prominent towards the
left. Facet hypertrophy. Stenosis of the lateral recesses and neural
foramina that could cause neural compression on either or both
sides. Flexion extension views show rocking motion and slight
abnormal translation at this level.

L2-3: Shallow disc protrusion slightly more prominent towards the
left. Narrowing of the lateral recesses left more than right.

L1-2: Disc bulge. Mild lateral recess narrowing without visible
neural compression.

## 2020-07-07 DIAGNOSIS — M4316 Spondylolisthesis, lumbar region: Secondary | ICD-10-CM | POA: Diagnosis not present

## 2020-07-07 DIAGNOSIS — M5416 Radiculopathy, lumbar region: Secondary | ICD-10-CM | POA: Diagnosis not present

## 2020-07-07 DIAGNOSIS — M4326 Fusion of spine, lumbar region: Secondary | ICD-10-CM | POA: Diagnosis not present

## 2020-07-07 DIAGNOSIS — M5136 Other intervertebral disc degeneration, lumbar region: Secondary | ICD-10-CM | POA: Diagnosis not present

## 2020-07-08 DIAGNOSIS — Z01810 Encounter for preprocedural cardiovascular examination: Secondary | ICD-10-CM | POA: Diagnosis not present

## 2020-07-08 DIAGNOSIS — D539 Nutritional anemia, unspecified: Secondary | ICD-10-CM | POA: Diagnosis not present

## 2020-07-08 DIAGNOSIS — E785 Hyperlipidemia, unspecified: Secondary | ICD-10-CM | POA: Diagnosis not present

## 2020-07-08 DIAGNOSIS — G2581 Restless legs syndrome: Secondary | ICD-10-CM | POA: Diagnosis not present

## 2020-07-08 DIAGNOSIS — M1990 Unspecified osteoarthritis, unspecified site: Secondary | ICD-10-CM | POA: Diagnosis not present

## 2020-07-08 DIAGNOSIS — R7989 Other specified abnormal findings of blood chemistry: Secondary | ICD-10-CM | POA: Diagnosis not present

## 2020-07-08 DIAGNOSIS — I10 Essential (primary) hypertension: Secondary | ICD-10-CM | POA: Diagnosis not present

## 2020-07-08 DIAGNOSIS — Z6832 Body mass index (BMI) 32.0-32.9, adult: Secondary | ICD-10-CM | POA: Diagnosis not present

## 2020-07-08 DIAGNOSIS — M48062 Spinal stenosis, lumbar region with neurogenic claudication: Secondary | ICD-10-CM | POA: Diagnosis not present

## 2020-07-13 DIAGNOSIS — E669 Obesity, unspecified: Secondary | ICD-10-CM | POA: Diagnosis not present

## 2020-07-13 DIAGNOSIS — Z0000 Encounter for general adult medical examination without abnormal findings: Secondary | ICD-10-CM | POA: Diagnosis not present

## 2020-07-13 DIAGNOSIS — E785 Hyperlipidemia, unspecified: Secondary | ICD-10-CM | POA: Diagnosis not present

## 2020-07-13 DIAGNOSIS — Z1331 Encounter for screening for depression: Secondary | ICD-10-CM | POA: Diagnosis not present

## 2020-07-13 DIAGNOSIS — Z9181 History of falling: Secondary | ICD-10-CM | POA: Diagnosis not present

## 2020-07-22 DIAGNOSIS — M48062 Spinal stenosis, lumbar region with neurogenic claudication: Secondary | ICD-10-CM | POA: Diagnosis not present

## 2020-07-22 DIAGNOSIS — M4326 Fusion of spine, lumbar region: Secondary | ICD-10-CM | POA: Diagnosis not present

## 2020-07-22 DIAGNOSIS — M4716 Other spondylosis with myelopathy, lumbar region: Secondary | ICD-10-CM | POA: Diagnosis not present

## 2020-07-22 DIAGNOSIS — M4316 Spondylolisthesis, lumbar region: Secondary | ICD-10-CM | POA: Diagnosis not present

## 2020-07-28 DIAGNOSIS — M4726 Other spondylosis with radiculopathy, lumbar region: Secondary | ICD-10-CM | POA: Diagnosis not present

## 2020-07-28 DIAGNOSIS — Z01812 Encounter for preprocedural laboratory examination: Secondary | ICD-10-CM | POA: Diagnosis not present

## 2020-08-04 DIAGNOSIS — M5116 Intervertebral disc disorders with radiculopathy, lumbar region: Secondary | ICD-10-CM | POA: Diagnosis not present

## 2020-08-04 DIAGNOSIS — M5126 Other intervertebral disc displacement, lumbar region: Secondary | ICD-10-CM | POA: Diagnosis not present

## 2020-08-04 DIAGNOSIS — M48062 Spinal stenosis, lumbar region with neurogenic claudication: Secondary | ICD-10-CM | POA: Diagnosis not present

## 2020-08-04 DIAGNOSIS — M532X6 Spinal instabilities, lumbar region: Secondary | ICD-10-CM | POA: Diagnosis not present

## 2020-08-04 DIAGNOSIS — M48061 Spinal stenosis, lumbar region without neurogenic claudication: Secondary | ICD-10-CM | POA: Diagnosis not present

## 2020-08-04 DIAGNOSIS — M533 Sacrococcygeal disorders, not elsewhere classified: Secondary | ICD-10-CM | POA: Diagnosis not present

## 2020-08-04 DIAGNOSIS — M4326 Fusion of spine, lumbar region: Secondary | ICD-10-CM | POA: Diagnosis not present

## 2020-08-04 DIAGNOSIS — M4316 Spondylolisthesis, lumbar region: Secondary | ICD-10-CM | POA: Diagnosis not present

## 2020-08-04 DIAGNOSIS — Z981 Arthrodesis status: Secondary | ICD-10-CM | POA: Diagnosis not present

## 2020-08-04 DIAGNOSIS — M5106 Intervertebral disc disorders with myelopathy, lumbar region: Secondary | ICD-10-CM | POA: Diagnosis not present

## 2020-09-02 DIAGNOSIS — M4326 Fusion of spine, lumbar region: Secondary | ICD-10-CM | POA: Diagnosis not present

## 2020-10-14 DIAGNOSIS — M25562 Pain in left knee: Secondary | ICD-10-CM | POA: Diagnosis not present

## 2020-10-14 DIAGNOSIS — M5459 Other low back pain: Secondary | ICD-10-CM | POA: Diagnosis not present

## 2020-10-14 DIAGNOSIS — M542 Cervicalgia: Secondary | ICD-10-CM | POA: Diagnosis not present

## 2020-10-14 DIAGNOSIS — M6281 Muscle weakness (generalized): Secondary | ICD-10-CM | POA: Diagnosis not present

## 2020-10-26 ENCOUNTER — Telehealth: Payer: Self-pay

## 2020-10-26 ENCOUNTER — Other Ambulatory Visit: Payer: Self-pay | Admitting: Orthopaedic Surgery

## 2020-10-26 DIAGNOSIS — M48061 Spinal stenosis, lumbar region without neurogenic claudication: Secondary | ICD-10-CM | POA: Insufficient documentation

## 2020-10-26 DIAGNOSIS — M4316 Spondylolisthesis, lumbar region: Secondary | ICD-10-CM

## 2020-10-26 DIAGNOSIS — N83202 Unspecified ovarian cyst, left side: Secondary | ICD-10-CM | POA: Insufficient documentation

## 2020-10-26 NOTE — Telephone Encounter (Signed)
Spoke with patient for [place] to screen her medications before getting her scheduled for a lumbar myelogram.  She stated an understanding that she will be here two hours and will need a driver.  She has no medications to hold for this procedure.

## 2020-11-01 ENCOUNTER — Ambulatory Visit
Admission: RE | Admit: 2020-11-01 | Discharge: 2020-11-01 | Disposition: A | Discharge status: Home / Self Care | Payer: Medicare Other | Source: Ambulatory Visit | Attending: Orthopaedic Surgery | Admitting: Orthopaedic Surgery

## 2020-11-01 DIAGNOSIS — M48061 Spinal stenosis, lumbar region without neurogenic claudication: Secondary | ICD-10-CM | POA: Diagnosis not present

## 2020-11-01 DIAGNOSIS — M4326 Fusion of spine, lumbar region: Secondary | ICD-10-CM | POA: Diagnosis not present

## 2020-11-01 DIAGNOSIS — M4316 Spondylolisthesis, lumbar region: Secondary | ICD-10-CM

## 2020-11-01 MED ORDER — IOPAMIDOL (ISOVUE-M 300) INJECTION 61%: 10.0000 mL | Freq: Once | INTRAMUSCULAR | Status: DC

## 2020-11-01 MED ORDER — IOPAMIDOL (ISOVUE-M 200) INJECTION 41%
18.0000 mL | Freq: Once | INTRAMUSCULAR | Status: AC
  Administered 2020-11-01: 18 mL via INTRATHECAL

## 2020-11-01 MED ORDER — MEPERIDINE HCL 50 MG/ML IJ SOLN
50.0000 mg | Freq: Once | INTRAMUSCULAR | Status: AC
  Administered 2020-11-01: 50 mg via INTRAMUSCULAR

## 2020-11-01 MED ORDER — ONDANSETRON HCL 4 MG/2ML IJ SOLN
4.0000 mg | Freq: Once | INTRAMUSCULAR | Status: AC
  Administered 2020-11-01: 4 mg via INTRAMUSCULAR

## 2020-11-01 MED ORDER — DIAZEPAM 5 MG PO TABS
5.0000 mg | ORAL_TABLET | Freq: Once | ORAL | Status: AC
  Administered 2020-11-01: 5 mg via ORAL

## 2020-11-01 NOTE — Discharge Instructions (Signed)

## 2020-11-07 DIAGNOSIS — Z6832 Body mass index (BMI) 32.0-32.9, adult: Secondary | ICD-10-CM | POA: Diagnosis not present

## 2020-11-07 DIAGNOSIS — M1990 Unspecified osteoarthritis, unspecified site: Secondary | ICD-10-CM | POA: Diagnosis not present

## 2020-11-07 DIAGNOSIS — E785 Hyperlipidemia, unspecified: Secondary | ICD-10-CM | POA: Diagnosis not present

## 2020-11-07 DIAGNOSIS — G2581 Restless legs syndrome: Secondary | ICD-10-CM | POA: Diagnosis not present

## 2020-11-07 DIAGNOSIS — D539 Nutritional anemia, unspecified: Secondary | ICD-10-CM | POA: Diagnosis not present

## 2020-11-07 DIAGNOSIS — I10 Essential (primary) hypertension: Secondary | ICD-10-CM | POA: Diagnosis not present

## 2020-11-07 DIAGNOSIS — M48062 Spinal stenosis, lumbar region with neurogenic claudication: Secondary | ICD-10-CM | POA: Diagnosis not present

## 2020-11-07 DIAGNOSIS — R7989 Other specified abnormal findings of blood chemistry: Secondary | ICD-10-CM | POA: Diagnosis not present

## 2020-11-16 DIAGNOSIS — M25562 Pain in left knee: Secondary | ICD-10-CM | POA: Diagnosis not present

## 2020-11-16 DIAGNOSIS — M4326 Fusion of spine, lumbar region: Secondary | ICD-10-CM | POA: Diagnosis not present

## 2020-11-24 DIAGNOSIS — N952 Postmenopausal atrophic vaginitis: Secondary | ICD-10-CM | POA: Diagnosis not present

## 2020-11-24 DIAGNOSIS — Z01419 Encounter for gynecological examination (general) (routine) without abnormal findings: Secondary | ICD-10-CM | POA: Diagnosis not present

## 2020-11-24 DIAGNOSIS — Z6832 Body mass index (BMI) 32.0-32.9, adult: Secondary | ICD-10-CM | POA: Diagnosis not present

## 2020-11-24 DIAGNOSIS — M810 Age-related osteoporosis without current pathological fracture: Secondary | ICD-10-CM | POA: Diagnosis not present

## 2020-12-09 DIAGNOSIS — T84216A Breakdown (mechanical) of internal fixation device of vertebrae, initial encounter: Secondary | ICD-10-CM | POA: Diagnosis not present

## 2020-12-09 DIAGNOSIS — M48062 Spinal stenosis, lumbar region with neurogenic claudication: Secondary | ICD-10-CM | POA: Diagnosis not present

## 2020-12-09 DIAGNOSIS — M4716 Other spondylosis with myelopathy, lumbar region: Secondary | ICD-10-CM | POA: Diagnosis not present

## 2020-12-09 DIAGNOSIS — M960 Pseudarthrosis after fusion or arthrodesis: Secondary | ICD-10-CM | POA: Diagnosis not present

## 2020-12-15 DIAGNOSIS — Z01812 Encounter for preprocedural laboratory examination: Secondary | ICD-10-CM | POA: Diagnosis not present

## 2020-12-15 DIAGNOSIS — M4726 Other spondylosis with radiculopathy, lumbar region: Secondary | ICD-10-CM | POA: Diagnosis not present

## 2020-12-19 DIAGNOSIS — M960 Pseudarthrosis after fusion or arthrodesis: Secondary | ICD-10-CM | POA: Diagnosis not present

## 2020-12-19 DIAGNOSIS — T84216A Breakdown (mechanical) of internal fixation device of vertebrae, initial encounter: Secondary | ICD-10-CM | POA: Diagnosis not present

## 2020-12-19 DIAGNOSIS — Z981 Arthrodesis status: Secondary | ICD-10-CM | POA: Diagnosis not present

## 2020-12-19 DIAGNOSIS — M48062 Spinal stenosis, lumbar region with neurogenic claudication: Secondary | ICD-10-CM | POA: Diagnosis not present

## 2020-12-19 DIAGNOSIS — M4716 Other spondylosis with myelopathy, lumbar region: Secondary | ICD-10-CM | POA: Diagnosis not present

## 2020-12-20 DIAGNOSIS — M4326 Fusion of spine, lumbar region: Secondary | ICD-10-CM | POA: Diagnosis not present

## 2020-12-20 DIAGNOSIS — I700 Atherosclerosis of aorta: Secondary | ICD-10-CM | POA: Diagnosis not present

## 2020-12-20 DIAGNOSIS — Z7982 Long term (current) use of aspirin: Secondary | ICD-10-CM | POA: Diagnosis not present

## 2020-12-20 DIAGNOSIS — M545 Low back pain, unspecified: Secondary | ICD-10-CM | POA: Diagnosis not present

## 2020-12-20 DIAGNOSIS — M960 Pseudarthrosis after fusion or arthrodesis: Secondary | ICD-10-CM | POA: Diagnosis present

## 2020-12-20 DIAGNOSIS — Z79899 Other long term (current) drug therapy: Secondary | ICD-10-CM | POA: Diagnosis not present

## 2020-12-20 DIAGNOSIS — M4726 Other spondylosis with radiculopathy, lumbar region: Secondary | ICD-10-CM | POA: Diagnosis present

## 2020-12-20 DIAGNOSIS — Z7983 Long term (current) use of bisphosphonates: Secondary | ICD-10-CM | POA: Diagnosis not present

## 2020-12-20 DIAGNOSIS — T84216A Breakdown (mechanical) of internal fixation device of vertebrae, initial encounter: Secondary | ICD-10-CM | POA: Diagnosis present

## 2020-12-20 DIAGNOSIS — I10 Essential (primary) hypertension: Secondary | ICD-10-CM | POA: Diagnosis present

## 2020-12-20 DIAGNOSIS — M48061 Spinal stenosis, lumbar region without neurogenic claudication: Secondary | ICD-10-CM | POA: Diagnosis present

## 2021-01-19 DIAGNOSIS — M4326 Fusion of spine, lumbar region: Secondary | ICD-10-CM | POA: Diagnosis not present

## 2021-01-23 DIAGNOSIS — M545 Low back pain, unspecified: Secondary | ICD-10-CM | POA: Diagnosis not present

## 2021-01-23 DIAGNOSIS — R531 Weakness: Secondary | ICD-10-CM | POA: Diagnosis not present

## 2021-01-23 DIAGNOSIS — R2689 Other abnormalities of gait and mobility: Secondary | ICD-10-CM | POA: Diagnosis not present

## 2021-01-23 DIAGNOSIS — M2560 Stiffness of unspecified joint, not elsewhere classified: Secondary | ICD-10-CM | POA: Diagnosis not present

## 2021-01-27 DIAGNOSIS — R2689 Other abnormalities of gait and mobility: Secondary | ICD-10-CM | POA: Diagnosis not present

## 2021-01-27 DIAGNOSIS — M2560 Stiffness of unspecified joint, not elsewhere classified: Secondary | ICD-10-CM | POA: Diagnosis not present

## 2021-01-27 DIAGNOSIS — M545 Low back pain, unspecified: Secondary | ICD-10-CM | POA: Diagnosis not present

## 2021-01-27 DIAGNOSIS — R531 Weakness: Secondary | ICD-10-CM | POA: Diagnosis not present

## 2021-01-31 DIAGNOSIS — M545 Low back pain, unspecified: Secondary | ICD-10-CM | POA: Diagnosis not present

## 2021-01-31 DIAGNOSIS — R531 Weakness: Secondary | ICD-10-CM | POA: Diagnosis not present

## 2021-01-31 DIAGNOSIS — R2689 Other abnormalities of gait and mobility: Secondary | ICD-10-CM | POA: Diagnosis not present

## 2021-01-31 DIAGNOSIS — M2560 Stiffness of unspecified joint, not elsewhere classified: Secondary | ICD-10-CM | POA: Diagnosis not present

## 2021-02-01 DIAGNOSIS — R1909 Other intra-abdominal and pelvic swelling, mass and lump: Secondary | ICD-10-CM | POA: Diagnosis not present

## 2021-02-01 DIAGNOSIS — N83202 Unspecified ovarian cyst, left side: Secondary | ICD-10-CM | POA: Diagnosis not present

## 2021-02-03 DIAGNOSIS — R531 Weakness: Secondary | ICD-10-CM | POA: Diagnosis not present

## 2021-02-03 DIAGNOSIS — R2689 Other abnormalities of gait and mobility: Secondary | ICD-10-CM | POA: Diagnosis not present

## 2021-02-03 DIAGNOSIS — M2560 Stiffness of unspecified joint, not elsewhere classified: Secondary | ICD-10-CM | POA: Diagnosis not present

## 2021-02-03 DIAGNOSIS — M545 Low back pain, unspecified: Secondary | ICD-10-CM | POA: Diagnosis not present

## 2021-02-08 DIAGNOSIS — M545 Low back pain, unspecified: Secondary | ICD-10-CM | POA: Diagnosis not present

## 2021-02-08 DIAGNOSIS — M2560 Stiffness of unspecified joint, not elsewhere classified: Secondary | ICD-10-CM | POA: Diagnosis not present

## 2021-02-08 DIAGNOSIS — R2689 Other abnormalities of gait and mobility: Secondary | ICD-10-CM | POA: Diagnosis not present

## 2021-02-08 DIAGNOSIS — R531 Weakness: Secondary | ICD-10-CM | POA: Diagnosis not present

## 2021-02-09 DIAGNOSIS — Z7983 Long term (current) use of bisphosphonates: Secondary | ICD-10-CM | POA: Diagnosis not present

## 2021-02-09 DIAGNOSIS — N959 Unspecified menopausal and perimenopausal disorder: Secondary | ICD-10-CM | POA: Diagnosis not present

## 2021-02-09 DIAGNOSIS — M816 Localized osteoporosis [Lequesne]: Secondary | ICD-10-CM | POA: Diagnosis not present

## 2021-02-09 DIAGNOSIS — Z1231 Encounter for screening mammogram for malignant neoplasm of breast: Secondary | ICD-10-CM | POA: Diagnosis not present

## 2021-02-10 DIAGNOSIS — R2689 Other abnormalities of gait and mobility: Secondary | ICD-10-CM | POA: Diagnosis not present

## 2021-02-10 DIAGNOSIS — M2560 Stiffness of unspecified joint, not elsewhere classified: Secondary | ICD-10-CM | POA: Diagnosis not present

## 2021-02-10 DIAGNOSIS — R531 Weakness: Secondary | ICD-10-CM | POA: Diagnosis not present

## 2021-02-10 DIAGNOSIS — M545 Low back pain, unspecified: Secondary | ICD-10-CM | POA: Diagnosis not present

## 2021-02-14 DIAGNOSIS — R531 Weakness: Secondary | ICD-10-CM | POA: Diagnosis not present

## 2021-02-14 DIAGNOSIS — R2689 Other abnormalities of gait and mobility: Secondary | ICD-10-CM | POA: Diagnosis not present

## 2021-02-14 DIAGNOSIS — M545 Low back pain, unspecified: Secondary | ICD-10-CM | POA: Diagnosis not present

## 2021-02-14 DIAGNOSIS — M2560 Stiffness of unspecified joint, not elsewhere classified: Secondary | ICD-10-CM | POA: Diagnosis not present

## 2021-02-17 DIAGNOSIS — M545 Low back pain, unspecified: Secondary | ICD-10-CM | POA: Diagnosis not present

## 2021-02-17 DIAGNOSIS — R2689 Other abnormalities of gait and mobility: Secondary | ICD-10-CM | POA: Diagnosis not present

## 2021-02-17 DIAGNOSIS — R531 Weakness: Secondary | ICD-10-CM | POA: Diagnosis not present

## 2021-02-17 DIAGNOSIS — M2560 Stiffness of unspecified joint, not elsewhere classified: Secondary | ICD-10-CM | POA: Diagnosis not present

## 2021-02-21 DIAGNOSIS — M2560 Stiffness of unspecified joint, not elsewhere classified: Secondary | ICD-10-CM | POA: Diagnosis not present

## 2021-02-21 DIAGNOSIS — R2689 Other abnormalities of gait and mobility: Secondary | ICD-10-CM | POA: Diagnosis not present

## 2021-02-21 DIAGNOSIS — R531 Weakness: Secondary | ICD-10-CM | POA: Diagnosis not present

## 2021-02-21 DIAGNOSIS — M545 Low back pain, unspecified: Secondary | ICD-10-CM | POA: Diagnosis not present

## 2021-02-28 DIAGNOSIS — R531 Weakness: Secondary | ICD-10-CM | POA: Diagnosis not present

## 2021-02-28 DIAGNOSIS — M2560 Stiffness of unspecified joint, not elsewhere classified: Secondary | ICD-10-CM | POA: Diagnosis not present

## 2021-02-28 DIAGNOSIS — R2689 Other abnormalities of gait and mobility: Secondary | ICD-10-CM | POA: Diagnosis not present

## 2021-02-28 DIAGNOSIS — M545 Low back pain, unspecified: Secondary | ICD-10-CM | POA: Diagnosis not present

## 2021-03-07 DIAGNOSIS — M545 Low back pain, unspecified: Secondary | ICD-10-CM | POA: Diagnosis not present

## 2021-03-07 DIAGNOSIS — M2560 Stiffness of unspecified joint, not elsewhere classified: Secondary | ICD-10-CM | POA: Diagnosis not present

## 2021-03-07 DIAGNOSIS — R2689 Other abnormalities of gait and mobility: Secondary | ICD-10-CM | POA: Diagnosis not present

## 2021-03-07 DIAGNOSIS — R531 Weakness: Secondary | ICD-10-CM | POA: Diagnosis not present

## 2021-03-15 DIAGNOSIS — M1712 Unilateral primary osteoarthritis, left knee: Secondary | ICD-10-CM | POA: Diagnosis not present

## 2021-03-15 DIAGNOSIS — I10 Essential (primary) hypertension: Secondary | ICD-10-CM | POA: Diagnosis not present

## 2021-03-15 DIAGNOSIS — M25562 Pain in left knee: Secondary | ICD-10-CM | POA: Diagnosis not present

## 2021-03-15 DIAGNOSIS — M4326 Fusion of spine, lumbar region: Secondary | ICD-10-CM | POA: Diagnosis not present

## 2021-03-16 DIAGNOSIS — Z23 Encounter for immunization: Secondary | ICD-10-CM | POA: Diagnosis not present

## 2021-03-16 DIAGNOSIS — Z6832 Body mass index (BMI) 32.0-32.9, adult: Secondary | ICD-10-CM | POA: Diagnosis not present

## 2021-03-16 DIAGNOSIS — M48062 Spinal stenosis, lumbar region with neurogenic claudication: Secondary | ICD-10-CM | POA: Diagnosis not present

## 2021-03-16 DIAGNOSIS — D539 Nutritional anemia, unspecified: Secondary | ICD-10-CM | POA: Diagnosis not present

## 2021-03-16 DIAGNOSIS — M1990 Unspecified osteoarthritis, unspecified site: Secondary | ICD-10-CM | POA: Diagnosis not present

## 2021-03-16 DIAGNOSIS — R7989 Other specified abnormal findings of blood chemistry: Secondary | ICD-10-CM | POA: Diagnosis not present

## 2021-03-16 DIAGNOSIS — I10 Essential (primary) hypertension: Secondary | ICD-10-CM | POA: Diagnosis not present

## 2021-03-16 DIAGNOSIS — G2581 Restless legs syndrome: Secondary | ICD-10-CM | POA: Diagnosis not present

## 2021-03-16 DIAGNOSIS — E785 Hyperlipidemia, unspecified: Secondary | ICD-10-CM | POA: Diagnosis not present

## 2021-03-28 DIAGNOSIS — M542 Cervicalgia: Secondary | ICD-10-CM | POA: Diagnosis not present

## 2021-03-28 DIAGNOSIS — M47892 Other spondylosis, cervical region: Secondary | ICD-10-CM | POA: Diagnosis not present

## 2021-04-10 DIAGNOSIS — M542 Cervicalgia: Secondary | ICD-10-CM | POA: Diagnosis not present

## 2021-04-12 DIAGNOSIS — M542 Cervicalgia: Secondary | ICD-10-CM | POA: Diagnosis not present

## 2021-04-18 DIAGNOSIS — M542 Cervicalgia: Secondary | ICD-10-CM | POA: Diagnosis not present

## 2021-04-20 DIAGNOSIS — M542 Cervicalgia: Secondary | ICD-10-CM | POA: Diagnosis not present

## 2021-04-25 DIAGNOSIS — M542 Cervicalgia: Secondary | ICD-10-CM | POA: Diagnosis not present

## 2021-04-27 DIAGNOSIS — M542 Cervicalgia: Secondary | ICD-10-CM | POA: Diagnosis not present

## 2021-04-28 DIAGNOSIS — I10 Essential (primary) hypertension: Secondary | ICD-10-CM | POA: Diagnosis not present

## 2021-04-28 DIAGNOSIS — Z6831 Body mass index (BMI) 31.0-31.9, adult: Secondary | ICD-10-CM | POA: Diagnosis not present

## 2021-04-28 DIAGNOSIS — G4730 Sleep apnea, unspecified: Secondary | ICD-10-CM | POA: Diagnosis not present

## 2021-04-28 DIAGNOSIS — R7309 Other abnormal glucose: Secondary | ICD-10-CM | POA: Diagnosis not present

## 2021-05-22 DIAGNOSIS — M1712 Unilateral primary osteoarthritis, left knee: Secondary | ICD-10-CM | POA: Diagnosis not present

## 2021-05-22 DIAGNOSIS — M25562 Pain in left knee: Secondary | ICD-10-CM | POA: Diagnosis not present

## 2021-06-15 DIAGNOSIS — M4326 Fusion of spine, lumbar region: Secondary | ICD-10-CM | POA: Diagnosis not present

## 2021-06-15 DIAGNOSIS — Z6829 Body mass index (BMI) 29.0-29.9, adult: Secondary | ICD-10-CM | POA: Diagnosis not present

## 2021-06-23 DIAGNOSIS — R7309 Other abnormal glucose: Secondary | ICD-10-CM | POA: Diagnosis not present

## 2021-06-23 DIAGNOSIS — E559 Vitamin D deficiency, unspecified: Secondary | ICD-10-CM | POA: Diagnosis not present

## 2021-06-23 DIAGNOSIS — I10 Essential (primary) hypertension: Secondary | ICD-10-CM | POA: Diagnosis not present

## 2021-06-23 DIAGNOSIS — Z6830 Body mass index (BMI) 30.0-30.9, adult: Secondary | ICD-10-CM | POA: Diagnosis not present

## 2021-06-23 DIAGNOSIS — E785 Hyperlipidemia, unspecified: Secondary | ICD-10-CM | POA: Diagnosis not present

## 2021-06-23 DIAGNOSIS — G4730 Sleep apnea, unspecified: Secondary | ICD-10-CM | POA: Diagnosis not present

## 2021-06-23 DIAGNOSIS — R42 Dizziness and giddiness: Secondary | ICD-10-CM | POA: Diagnosis not present

## 2021-07-13 DIAGNOSIS — I10 Essential (primary) hypertension: Secondary | ICD-10-CM | POA: Diagnosis not present

## 2021-07-13 DIAGNOSIS — G2581 Restless legs syndrome: Secondary | ICD-10-CM | POA: Diagnosis not present

## 2021-07-17 DIAGNOSIS — R400 Somnolence: Secondary | ICD-10-CM | POA: Diagnosis not present

## 2021-07-17 DIAGNOSIS — G2581 Restless legs syndrome: Secondary | ICD-10-CM | POA: Diagnosis not present

## 2021-07-17 DIAGNOSIS — G4733 Obstructive sleep apnea (adult) (pediatric): Secondary | ICD-10-CM | POA: Diagnosis not present

## 2021-07-18 DIAGNOSIS — E611 Iron deficiency: Secondary | ICD-10-CM | POA: Diagnosis not present

## 2021-07-18 DIAGNOSIS — R400 Somnolence: Secondary | ICD-10-CM | POA: Diagnosis not present

## 2021-07-18 DIAGNOSIS — D529 Folate deficiency anemia, unspecified: Secondary | ICD-10-CM | POA: Diagnosis not present

## 2021-08-03 DIAGNOSIS — I10 Essential (primary) hypertension: Secondary | ICD-10-CM | POA: Diagnosis not present

## 2021-08-03 DIAGNOSIS — G2581 Restless legs syndrome: Secondary | ICD-10-CM | POA: Diagnosis not present

## 2021-08-03 DIAGNOSIS — Z1331 Encounter for screening for depression: Secondary | ICD-10-CM | POA: Diagnosis not present

## 2021-08-03 DIAGNOSIS — Z9181 History of falling: Secondary | ICD-10-CM | POA: Diagnosis not present

## 2021-08-07 DIAGNOSIS — Z20822 Contact with and (suspected) exposure to covid-19: Secondary | ICD-10-CM | POA: Diagnosis not present

## 2021-08-11 DIAGNOSIS — Z20822 Contact with and (suspected) exposure to covid-19: Secondary | ICD-10-CM | POA: Diagnosis not present

## 2021-08-16 DIAGNOSIS — M7061 Trochanteric bursitis, right hip: Secondary | ICD-10-CM | POA: Diagnosis not present

## 2021-08-16 DIAGNOSIS — M4326 Fusion of spine, lumbar region: Secondary | ICD-10-CM | POA: Diagnosis not present

## 2021-08-21 DIAGNOSIS — M1712 Unilateral primary osteoarthritis, left knee: Secondary | ICD-10-CM | POA: Diagnosis not present

## 2021-08-28 DIAGNOSIS — Z20822 Contact with and (suspected) exposure to covid-19: Secondary | ICD-10-CM | POA: Diagnosis not present

## 2021-08-28 DIAGNOSIS — M1712 Unilateral primary osteoarthritis, left knee: Secondary | ICD-10-CM | POA: Diagnosis not present

## 2021-09-08 DIAGNOSIS — G2581 Restless legs syndrome: Secondary | ICD-10-CM | POA: Diagnosis not present

## 2021-09-08 DIAGNOSIS — R252 Cramp and spasm: Secondary | ICD-10-CM | POA: Diagnosis not present

## 2021-09-08 DIAGNOSIS — E538 Deficiency of other specified B group vitamins: Secondary | ICD-10-CM | POA: Diagnosis not present

## 2021-09-11 ENCOUNTER — Encounter: Payer: Self-pay | Admitting: Neurology

## 2021-09-18 DIAGNOSIS — Z20822 Contact with and (suspected) exposure to covid-19: Secondary | ICD-10-CM | POA: Diagnosis not present

## 2021-09-22 DIAGNOSIS — R252 Cramp and spasm: Secondary | ICD-10-CM | POA: Diagnosis not present

## 2021-09-22 DIAGNOSIS — M1990 Unspecified osteoarthritis, unspecified site: Secondary | ICD-10-CM | POA: Diagnosis not present

## 2021-09-22 DIAGNOSIS — I10 Essential (primary) hypertension: Secondary | ICD-10-CM | POA: Diagnosis not present

## 2021-09-22 DIAGNOSIS — E785 Hyperlipidemia, unspecified: Secondary | ICD-10-CM | POA: Diagnosis not present

## 2021-09-22 DIAGNOSIS — G2581 Restless legs syndrome: Secondary | ICD-10-CM | POA: Diagnosis not present

## 2021-09-22 DIAGNOSIS — G4700 Insomnia, unspecified: Secondary | ICD-10-CM | POA: Diagnosis not present

## 2021-09-22 DIAGNOSIS — R1013 Epigastric pain: Secondary | ICD-10-CM | POA: Diagnosis not present

## 2021-10-04 DIAGNOSIS — S20212A Contusion of left front wall of thorax, initial encounter: Secondary | ICD-10-CM | POA: Diagnosis not present

## 2021-10-04 DIAGNOSIS — M546 Pain in thoracic spine: Secondary | ICD-10-CM | POA: Diagnosis not present

## 2021-10-27 DIAGNOSIS — M1712 Unilateral primary osteoarthritis, left knee: Secondary | ICD-10-CM | POA: Diagnosis not present

## 2021-11-03 DIAGNOSIS — M1712 Unilateral primary osteoarthritis, left knee: Secondary | ICD-10-CM | POA: Diagnosis not present

## 2021-11-10 DIAGNOSIS — M1712 Unilateral primary osteoarthritis, left knee: Secondary | ICD-10-CM | POA: Diagnosis not present

## 2021-11-22 DIAGNOSIS — M545 Low back pain, unspecified: Secondary | ICD-10-CM | POA: Diagnosis not present

## 2021-11-22 DIAGNOSIS — M4326 Fusion of spine, lumbar region: Secondary | ICD-10-CM | POA: Diagnosis not present

## 2021-11-22 DIAGNOSIS — M5106 Intervertebral disc disorders with myelopathy, lumbar region: Secondary | ICD-10-CM | POA: Diagnosis not present

## 2021-11-22 DIAGNOSIS — M542 Cervicalgia: Secondary | ICD-10-CM | POA: Diagnosis not present

## 2021-11-22 DIAGNOSIS — M549 Dorsalgia, unspecified: Secondary | ICD-10-CM | POA: Diagnosis not present

## 2021-11-27 DIAGNOSIS — Z1231 Encounter for screening mammogram for malignant neoplasm of breast: Secondary | ICD-10-CM | POA: Diagnosis not present

## 2021-11-27 DIAGNOSIS — Z6830 Body mass index (BMI) 30.0-30.9, adult: Secondary | ICD-10-CM | POA: Diagnosis not present

## 2021-11-27 DIAGNOSIS — Z01419 Encounter for gynecological examination (general) (routine) without abnormal findings: Secondary | ICD-10-CM | POA: Diagnosis not present

## 2021-12-01 DIAGNOSIS — Z139 Encounter for screening, unspecified: Secondary | ICD-10-CM | POA: Diagnosis not present

## 2021-12-01 DIAGNOSIS — E785 Hyperlipidemia, unspecified: Secondary | ICD-10-CM | POA: Diagnosis not present

## 2021-12-01 DIAGNOSIS — Z1331 Encounter for screening for depression: Secondary | ICD-10-CM | POA: Diagnosis not present

## 2021-12-01 DIAGNOSIS — Z9181 History of falling: Secondary | ICD-10-CM | POA: Diagnosis not present

## 2021-12-01 DIAGNOSIS — Z0000 Encounter for general adult medical examination without abnormal findings: Secondary | ICD-10-CM | POA: Diagnosis not present

## 2021-12-13 DIAGNOSIS — M545 Low back pain, unspecified: Secondary | ICD-10-CM | POA: Diagnosis not present

## 2021-12-13 DIAGNOSIS — M542 Cervicalgia: Secondary | ICD-10-CM | POA: Diagnosis not present

## 2021-12-13 DIAGNOSIS — M4326 Fusion of spine, lumbar region: Secondary | ICD-10-CM | POA: Diagnosis not present

## 2021-12-25 DIAGNOSIS — E785 Hyperlipidemia, unspecified: Secondary | ICD-10-CM | POA: Diagnosis not present

## 2021-12-25 DIAGNOSIS — R1013 Epigastric pain: Secondary | ICD-10-CM | POA: Diagnosis not present

## 2021-12-25 DIAGNOSIS — I10 Essential (primary) hypertension: Secondary | ICD-10-CM | POA: Diagnosis not present

## 2021-12-25 DIAGNOSIS — R252 Cramp and spasm: Secondary | ICD-10-CM | POA: Diagnosis not present

## 2021-12-25 DIAGNOSIS — R7309 Other abnormal glucose: Secondary | ICD-10-CM | POA: Diagnosis not present

## 2021-12-25 DIAGNOSIS — M1990 Unspecified osteoarthritis, unspecified site: Secondary | ICD-10-CM | POA: Diagnosis not present

## 2021-12-25 DIAGNOSIS — G2581 Restless legs syndrome: Secondary | ICD-10-CM | POA: Diagnosis not present

## 2021-12-25 DIAGNOSIS — G4700 Insomnia, unspecified: Secondary | ICD-10-CM | POA: Diagnosis not present

## 2022-01-03 DIAGNOSIS — M542 Cervicalgia: Secondary | ICD-10-CM | POA: Diagnosis not present

## 2022-01-03 DIAGNOSIS — M25551 Pain in right hip: Secondary | ICD-10-CM | POA: Diagnosis not present

## 2022-01-08 DIAGNOSIS — M542 Cervicalgia: Secondary | ICD-10-CM | POA: Diagnosis not present

## 2022-01-08 DIAGNOSIS — M25551 Pain in right hip: Secondary | ICD-10-CM | POA: Diagnosis not present

## 2022-01-10 DIAGNOSIS — E875 Hyperkalemia: Secondary | ICD-10-CM | POA: Diagnosis not present

## 2022-01-19 DIAGNOSIS — M7071 Other bursitis of hip, right hip: Secondary | ICD-10-CM | POA: Diagnosis not present

## 2022-01-19 DIAGNOSIS — M25551 Pain in right hip: Secondary | ICD-10-CM | POA: Diagnosis not present

## 2022-01-23 DIAGNOSIS — M545 Low back pain, unspecified: Secondary | ICD-10-CM | POA: Diagnosis not present

## 2022-01-23 DIAGNOSIS — M25562 Pain in left knee: Secondary | ICD-10-CM | POA: Diagnosis not present

## 2022-01-23 DIAGNOSIS — M4326 Fusion of spine, lumbar region: Secondary | ICD-10-CM | POA: Diagnosis not present

## 2022-01-30 DIAGNOSIS — M1712 Unilateral primary osteoarthritis, left knee: Secondary | ICD-10-CM | POA: Diagnosis not present

## 2022-01-30 DIAGNOSIS — M21162 Varus deformity, not elsewhere classified, left knee: Secondary | ICD-10-CM | POA: Diagnosis not present

## 2022-01-30 DIAGNOSIS — M13862 Other specified arthritis, left knee: Secondary | ICD-10-CM | POA: Diagnosis not present

## 2022-02-09 ENCOUNTER — Encounter: Payer: Self-pay | Admitting: Neurology

## 2022-02-09 ENCOUNTER — Ambulatory Visit (INDEPENDENT_AMBULATORY_CARE_PROVIDER_SITE_OTHER): Payer: Medicare Other | Admitting: Neurology

## 2022-02-09 VITALS — BP 167/79 | HR 66 | Ht 59.5 in | Wt 152.0 lb

## 2022-02-09 DIAGNOSIS — G2581 Restless legs syndrome: Secondary | ICD-10-CM

## 2022-02-09 DIAGNOSIS — R252 Cramp and spasm: Secondary | ICD-10-CM | POA: Diagnosis not present

## 2022-02-09 MED ORDER — CARBIDOPA-LEVODOPA 25-100 MG PO TABS: ORAL_TABLET | ORAL | 5 refills | Status: AC

## 2022-02-09 MED ORDER — ROPINIROLE HCL 1 MG PO TABS: ORAL_TABLET | ORAL | 5 refills | Status: AC

## 2022-02-09 NOTE — Progress Notes (Signed)
[facility] Clinic Note - Initial Visit   Date: 02/09/2022   Linda Mcdonald MRN: [id number] DOB: [birth year]   Dear Laverna Peace, NP:  Thank you for your kind referral of Linda Mcdonald for consultation of restless leg syndrome. Although her history is well known to you, please allow [place] to reiterate it for the purpose of our medical record. The patient was accompanied to the clinic by self.   Linda Mcdonald is a 71 y.o. right-handed female with hyperlipidemia, GERD, OSA, hypertension, lumbar canal stenosis s/p surgery x 5 and osteoporosis presenting for evaluation of restless leg syndrome.   IMPRESSION/PLAN: Restless leg syndrome, worsening.  Patient has been on dopamine agonist and we discussed the potential of augmentation.  At this time, I will plan on increasing her ropinirole, however, if she has worsening symptoms while increasing her dopamine agonist, we may need to taper her off it and optimize gabapentin, which she is already taking.   - Continue ropinirole '1mg'$  at 4p and increase to 1.'5mg'$  at bedtime.  Monitor for paradoxical worsening  - Start sinemet 25/100 as needed for severe RLS  - Patient will check with PCP about getting ferritin level checked, if not already done  2.  Muscle cramps, most likely due to lumbar spondylosis  - Start posterior leg stretches at nighttime  - Stay well-hydrated  - OK to try magnesium '400mg'$  daily or tonic water  - Consider muscle relaxant going forward  Return to clinic in 4 months  ------------------------------------------------------------- History of present illness: She has restless leg syndrome for at least the past 20 years.  She abnormal sensation of the skin worse in the legs and arms.  It is worse in the legs and alleviated with walking and worse at rest.  She has previously tried ropinirole and tried pramipexole.  She currently takes ropinirole '1mg'$  twice daily at 4p and 10p.  Most of the time, this regimen  controls her symptoms.  She gets up at night to walk anywhere from 27mn - 1h about 5 nights per week.   She also takes gabapentin '300mg'$  twice daily for neuropathic pain related to her back surgery. She has cramps in the legs, which often occur in the morning when she is stretching.  It tends to occur in the feet and legs, but sometime also in the hands. No weakness.    Past Medical History:  Diagnosis Date   Chronic back pain    GERD (gastroesophageal reflux disease)    Hypertension    Restless leg syndrome    Rotator cuff dysfunction, right     Past Surgical History:  Procedure Laterality Date   BACK SURGERY     x2   BREAST BIOPSY Right 12/2016   benign    CESAREAN SECTION     COLONOSCOPY     OVARIAN CYST SURGERY Right    SHOULDER ARTHROSCOPY WITH ROTATOR CUFF REPAIR Right 10/29/2018   Procedure: SHOULDER ARTHROSCOPY WITH OPEN ROTATOR CUFF REPAIR;  Surgeon: CEarlie Server MD;  [facility]  Service: Orthopedics;  Laterality: Right;  PRE POST SCALENE BLOCK   SHOULDER ARTHROSCOPY WITH SUBACROMIAL DECOMPRESSION Right 10/29/2018   Procedure: SHOULDER ARTHROSCOPY WITH SUBACROMIAL DECOMPRESSION;  Surgeon: CEarlie Server MD;  [facility]  Service: Orthopedics;  Laterality: Right;   TONSILLECTOMY       Medications:  Outpatient Encounter Medications as of 02/09/2022  Medication Sig   alendronate (FOSAMAX) 70 MG tablet Take 70 mg by mouth once a week.  aspirin EC 81 MG tablet Take 81 mg by mouth daily.   Biotin 10000 MCG TABS Take by mouth.   Calcium Carbonate-Vit D-Min (CALTRATE 600+D PLUS MINERALS) 600-800 MG-UNIT CHEW Chew by mouth.   Cobalamin Combinations (NEURIVA PLUS PO) Take by mouth.   Cyanocobalamin (VITAMIN B12 PO) Take by mouth. 2500 MCG   docusate sodium (COLACE) 100 MG capsule Take 100 mg by mouth 2 (two) times daily.   ezetimibe (ZETIA) 10 MG tablet Take 10 mg by mouth daily.   gabapentin (NEURONTIN) 300 MG capsule  Take 300 mg by mouth 2 (two) times daily. Take 1 tablet in the morning and 1 tablet in the evening.   hydrochlorothiazide (MICROZIDE) 12.5 MG capsule Take 12.5 mg by mouth daily.   Magnesium 400 MG TABS Take by mouth.   meloxicam (MOBIC) 15 MG tablet Take 15 mg by mouth daily.   Omega-3 1000 MG CAPS Take by mouth.   omeprazole (PRILOSEC) 20 MG capsule Take 20 mg by mouth daily.   rOPINIRole (REQUIP) 1 MG tablet Take 1 mg by mouth 2 (two) times daily.   Vitamin D, Ergocalciferol, 50 MCG (2000 UT) CAPS Take by mouth.   calcium carbonate (OSCAL) 1500 (600 Ca) MG TABS tablet Take 1,500 mg by mouth 2 (two) times daily with a meal. (Patient not taking: Reported on 02/09/2022)   calcium-vitamin D (OSCAL WITH D) 500-200 MG-UNIT tablet Take 1 tablet by mouth. (Patient not taking: Reported on 08/10/5186)   folic acid (FOLVITE) 1 MG tablet Take 1 mg by mouth daily.   oxyCODONE (OXY IR/ROXICODONE) 5 MG immediate release tablet Take one tab po q4-6hrs prn pain, may need 1-2 first couple weeks (Patient not taking: Reported on 02/09/2022)   pramipexole (MIRAPEX) 0.5 MG tablet Take 1 mg by mouth at bedtime. Take two every night (Patient not taking: Reported on 02/09/2022)   pravastatin (PRAVACHOL) 20 MG tablet Take 20 mg by mouth daily. (Patient not taking: Reported on 02/09/2022)   No facility-administered encounter medications on file as of 02/09/2022.    Allergies: No Known Allergies  Family History: Family History  Problem Relation Age of Onset   Heart failure Mother    Hypertension Mother    Osteoporosis Mother    Other Father        Back issues- 59 back surgeries   Heart block Father    Heart Problems Father    Breast cancer Paternal Aunt    Breast cancer Cousin     Social History: Social History   Tobacco Use   Smoking status: Never   Smokeless tobacco: Never  Substance Use Topics   Alcohol use: Yes    Comment: social   Drug use: Never   Social History   Social History Narrative    Right Handed   Lives in a two story home     Vital Signs:  BP (!) 167/79   Pulse 66   Ht 4' 11.5" (1.511 m)   Wt 152 lb (68.9 kg)   SpO2 99%   BMI 30.19 kg/m   Neurological Exam: MENTAL STATUS including orientation to time, place, person, recent and remote memory, attention span and concentration, language, and fund of knowledge is normal.  Speech is not dysarthric.  CRANIAL NERVES: II:  No visual field defects.     III-IV-VI: Pupils equal round and reactive to light.  Normal conjugate, extra-ocular eye movements in all directions of gaze.  No nystagmus.  No ptosis.   V:  Normal facial sensation.  VII:  Normal facial symmetry and movements.   VIII:  Normal hearing and vestibular function.   IX-X:  Normal palatal movement.   XI:  Normal shoulder shrug and head rotation.   XII:  Normal tongue strength and range of motion, no deviation or fasciculation.  MOTOR:  No atrophy, fasciculations or abnormal movements.  No pronator drift.   Upper Extremity:  Right  Left  Deltoid  5/5   5/5   Biceps  5/5   5/5   Wrist extensors  5/5   5/5   Wrist flexors  5/5   5/5   Finger extensors  5/5   5/5   Finger flexors  5/5   5/5   Dorsal interossei  5/5   5/5   Abductor pollicis  5/5   5/5   Tone (Ashworth scale)  0  0   Lower Extremity:  Right  Left  Hip flexors  5/5   5/5   Knee flexors  5/5   5/5   Knee extensors  5/5   5/5   Dorsiflexors  5/5   5/5   Plantarflexors  5/5   5/5   Toe extensors  5/5   5/5   Toe flexors  5/5   5/5   Tone (Ashworth scale)  0  0   MSRs:                                           Right        Left brachioradialis 2+  2+  biceps 2+  2+  triceps 2+  2+  patellar 2+  2+  ankle jerk 2+  2+  Hoffman no  no  plantar response down  down   SENSORY:  Normal and symmetric perception of light touch, pinprick, vibration, and proprioception.  Romberg's sign absent.   COORDINATION/GAIT: Normal finger-to- nose-finger and heel-to-shin.  Intact rapid  alternating movements bilaterally.  Gait narrow based and stable. Tandem and stressed gait intact.     Thank you for allowing me to participate in patient's care.  If I can answer any additional questions, I would be pleased to do so.    Sincerely,    Ledon Weihe K. Posey Pronto, DO

## 2022-02-09 NOTE — Patient Instructions (Addendum)
Please follow-up with your primary care doctor if they have checked ferritin level  Start taking ropinirole '1mg'$  at 4p and 1.'5mg'$  at bedtime  For severe restlessness, you can take sinemet 25/100 daily as needed  For muscle cramps, start leg stretches and stay well-hydrated  Return to clinic in 4 months

## 2022-02-19 ENCOUNTER — Ambulatory Visit: Payer: Medicare Other | Admitting: Neurology

## 2022-02-26 ENCOUNTER — Other Ambulatory Visit: Payer: Self-pay | Admitting: Orthopaedic Surgery

## 2022-02-26 DIAGNOSIS — M4326 Fusion of spine, lumbar region: Secondary | ICD-10-CM

## 2022-02-28 ENCOUNTER — Ambulatory Visit
Admission: RE | Admit: 2022-02-28 | Discharge: 2022-02-28 | Disposition: A | Discharge status: Home / Self Care | Payer: Medicare Other | Source: Ambulatory Visit | Attending: Orthopaedic Surgery | Admitting: Orthopaedic Surgery

## 2022-02-28 DIAGNOSIS — M4326 Fusion of spine, lumbar region: Secondary | ICD-10-CM | POA: Diagnosis not present

## 2022-02-28 MED ORDER — GADOPICLENOL 0.5 MMOL/ML IV SOLN
7.0000 mL | Freq: Once | INTRAVENOUS | Status: AC | PRN
  Administered 2022-02-28: 7 mL via INTRAVENOUS

## 2022-03-01 DIAGNOSIS — M4326 Fusion of spine, lumbar region: Secondary | ICD-10-CM | POA: Diagnosis not present

## 2022-03-01 DIAGNOSIS — M48062 Spinal stenosis, lumbar region with neurogenic claudication: Secondary | ICD-10-CM | POA: Diagnosis not present

## 2022-03-01 DIAGNOSIS — M545 Low back pain, unspecified: Secondary | ICD-10-CM | POA: Diagnosis not present

## 2022-03-01 DIAGNOSIS — M25562 Pain in left knee: Secondary | ICD-10-CM | POA: Diagnosis not present

## 2022-03-02 ENCOUNTER — Telehealth: Payer: Self-pay

## 2022-03-02 NOTE — Patient Outreach (Signed)
  Care Coordination   Initial Visit Note   03/02/2022 Name: Linda Mcdonald MRN: [id number] DOB: March 02, 1951  Linda Mcdonald is Mcdonald 71 y.o. year old female who sees Moon, Amy A, NP for primary care. I spoke with  Linda Mcdonald by phone today.  What matters to the patients health and wellness today?  Placed call to patient today to review and offer [facility] care coordination program.  Patient reports she is having back and knee problems but has follow up planned for interventions. Denies any current needs. Reviewed with patient if she needs assistance in the future to let MD know.  SDOH assessments and interventions completed:  No   Care Coordination Interventions Activated:  No  Care Coordination Interventions:  No, not indicated   Follow up plan: No further intervention required.   Encounter Outcome:  Pt. Refused   Tomasa Rand, RN, BSN, CEN [facility] [employer] [phone]

## 2022-03-14 DIAGNOSIS — M48062 Spinal stenosis, lumbar region with neurogenic claudication: Secondary | ICD-10-CM | POA: Diagnosis not present

## 2022-03-14 DIAGNOSIS — M5416 Radiculopathy, lumbar region: Secondary | ICD-10-CM | POA: Diagnosis not present

## 2022-03-14 DIAGNOSIS — M4326 Fusion of spine, lumbar region: Secondary | ICD-10-CM | POA: Diagnosis not present

## 2022-03-29 DIAGNOSIS — Z23 Encounter for immunization: Secondary | ICD-10-CM | POA: Diagnosis not present

## 2022-03-29 DIAGNOSIS — M1990 Unspecified osteoarthritis, unspecified site: Secondary | ICD-10-CM | POA: Diagnosis not present

## 2022-03-29 DIAGNOSIS — G4700 Insomnia, unspecified: Secondary | ICD-10-CM | POA: Diagnosis not present

## 2022-03-29 DIAGNOSIS — I10 Essential (primary) hypertension: Secondary | ICD-10-CM | POA: Diagnosis not present

## 2022-03-29 DIAGNOSIS — R7309 Other abnormal glucose: Secondary | ICD-10-CM | POA: Diagnosis not present

## 2022-03-29 DIAGNOSIS — E785 Hyperlipidemia, unspecified: Secondary | ICD-10-CM | POA: Diagnosis not present

## 2022-03-29 DIAGNOSIS — R1013 Epigastric pain: Secondary | ICD-10-CM | POA: Diagnosis not present

## 2022-03-29 DIAGNOSIS — G2581 Restless legs syndrome: Secondary | ICD-10-CM | POA: Diagnosis not present

## 2022-05-11 DIAGNOSIS — M532X6 Spinal instabilities, lumbar region: Secondary | ICD-10-CM | POA: Diagnosis not present

## 2022-05-11 DIAGNOSIS — M48062 Spinal stenosis, lumbar region with neurogenic claudication: Secondary | ICD-10-CM | POA: Diagnosis not present

## 2022-05-11 DIAGNOSIS — Z6832 Body mass index (BMI) 32.0-32.9, adult: Secondary | ICD-10-CM | POA: Diagnosis not present

## 2022-06-11 ENCOUNTER — Ambulatory Visit: Payer: Medicare Other | Admitting: Neurology

## 2022-06-22 DIAGNOSIS — Z01812 Encounter for preprocedural laboratory examination: Secondary | ICD-10-CM | POA: Diagnosis not present

## 2022-06-22 DIAGNOSIS — Z01810 Encounter for preprocedural cardiovascular examination: Secondary | ICD-10-CM | POA: Diagnosis not present

## 2022-06-22 DIAGNOSIS — M48062 Spinal stenosis, lumbar region with neurogenic claudication: Secondary | ICD-10-CM | POA: Diagnosis not present

## 2022-06-22 DIAGNOSIS — Z9889 Other specified postprocedural states: Secondary | ICD-10-CM | POA: Diagnosis not present

## 2022-07-09 DIAGNOSIS — M4316 Spondylolisthesis, lumbar region: Secondary | ICD-10-CM | POA: Diagnosis not present

## 2022-07-09 DIAGNOSIS — M532X6 Spinal instabilities, lumbar region: Secondary | ICD-10-CM | POA: Diagnosis not present

## 2022-07-09 DIAGNOSIS — M4726 Other spondylosis with radiculopathy, lumbar region: Secondary | ICD-10-CM | POA: Diagnosis not present

## 2022-07-09 DIAGNOSIS — M4155 Other secondary scoliosis, thoracolumbar region: Secondary | ICD-10-CM | POA: Diagnosis not present

## 2022-07-09 DIAGNOSIS — I10 Essential (primary) hypertension: Secondary | ICD-10-CM | POA: Diagnosis not present

## 2022-07-09 DIAGNOSIS — M960 Pseudarthrosis after fusion or arthrodesis: Secondary | ICD-10-CM | POA: Diagnosis not present

## 2022-07-09 DIAGNOSIS — Z7982 Long term (current) use of aspirin: Secondary | ICD-10-CM | POA: Diagnosis not present

## 2022-07-09 DIAGNOSIS — G96198 Other disorders of meninges, not elsewhere classified: Secondary | ICD-10-CM | POA: Diagnosis not present

## 2022-07-09 DIAGNOSIS — T84226A Displacement of internal fixation device of vertebrae, initial encounter: Secondary | ICD-10-CM | POA: Diagnosis not present

## 2022-07-09 DIAGNOSIS — Z79899 Other long term (current) drug therapy: Secondary | ICD-10-CM | POA: Diagnosis not present

## 2022-07-09 DIAGNOSIS — G2581 Restless legs syndrome: Secondary | ICD-10-CM | POA: Diagnosis not present

## 2022-07-09 DIAGNOSIS — M48062 Spinal stenosis, lumbar region with neurogenic claudication: Secondary | ICD-10-CM | POA: Diagnosis not present

## 2022-07-09 DIAGNOSIS — Z981 Arthrodesis status: Secondary | ICD-10-CM | POA: Diagnosis not present

## 2022-07-09 DIAGNOSIS — M963 Postlaminectomy kyphosis: Secondary | ICD-10-CM | POA: Diagnosis not present

## 2022-08-07 DIAGNOSIS — M48062 Spinal stenosis, lumbar region with neurogenic claudication: Secondary | ICD-10-CM | POA: Diagnosis not present

## 2022-08-27 DIAGNOSIS — M1990 Unspecified osteoarthritis, unspecified site: Secondary | ICD-10-CM | POA: Diagnosis not present

## 2022-08-27 DIAGNOSIS — R1013 Epigastric pain: Secondary | ICD-10-CM | POA: Diagnosis not present

## 2022-08-27 DIAGNOSIS — E785 Hyperlipidemia, unspecified: Secondary | ICD-10-CM | POA: Diagnosis not present

## 2022-08-27 DIAGNOSIS — I10 Essential (primary) hypertension: Secondary | ICD-10-CM | POA: Diagnosis not present

## 2022-08-27 DIAGNOSIS — G2581 Restless legs syndrome: Secondary | ICD-10-CM | POA: Diagnosis not present

## 2022-08-27 DIAGNOSIS — R7309 Other abnormal glucose: Secondary | ICD-10-CM | POA: Diagnosis not present

## 2022-08-27 DIAGNOSIS — I700 Atherosclerosis of aorta: Secondary | ICD-10-CM | POA: Diagnosis not present

## 2022-09-17 DIAGNOSIS — E875 Hyperkalemia: Secondary | ICD-10-CM | POA: Diagnosis not present

## 2022-10-11 DIAGNOSIS — M4716 Other spondylosis with myelopathy, lumbar region: Secondary | ICD-10-CM | POA: Diagnosis not present

## 2022-11-06 DIAGNOSIS — M545 Low back pain, unspecified: Secondary | ICD-10-CM | POA: Diagnosis not present

## 2022-11-12 DIAGNOSIS — M545 Low back pain, unspecified: Secondary | ICD-10-CM | POA: Diagnosis not present

## 2022-11-14 DIAGNOSIS — M545 Low back pain, unspecified: Secondary | ICD-10-CM | POA: Diagnosis not present

## 2022-11-19 DIAGNOSIS — M545 Low back pain, unspecified: Secondary | ICD-10-CM | POA: Diagnosis not present

## 2022-11-21 DIAGNOSIS — M545 Low back pain, unspecified: Secondary | ICD-10-CM | POA: Diagnosis not present

## 2022-11-26 DIAGNOSIS — M545 Low back pain, unspecified: Secondary | ICD-10-CM | POA: Diagnosis not present

## 2022-11-28 DIAGNOSIS — M545 Low back pain, unspecified: Secondary | ICD-10-CM | POA: Diagnosis not present

## 2022-11-29 DIAGNOSIS — Z01419 Encounter for gynecological examination (general) (routine) without abnormal findings: Secondary | ICD-10-CM | POA: Diagnosis not present

## 2022-11-29 DIAGNOSIS — Z1231 Encounter for screening mammogram for malignant neoplasm of breast: Secondary | ICD-10-CM | POA: Diagnosis not present

## 2022-11-29 DIAGNOSIS — Z6831 Body mass index (BMI) 31.0-31.9, adult: Secondary | ICD-10-CM | POA: Diagnosis not present

## 2022-12-03 DIAGNOSIS — M545 Low back pain, unspecified: Secondary | ICD-10-CM | POA: Diagnosis not present

## 2022-12-04 DIAGNOSIS — G2581 Restless legs syndrome: Secondary | ICD-10-CM | POA: Diagnosis not present

## 2022-12-04 DIAGNOSIS — R7309 Other abnormal glucose: Secondary | ICD-10-CM | POA: Diagnosis not present

## 2022-12-04 DIAGNOSIS — R1013 Epigastric pain: Secondary | ICD-10-CM | POA: Diagnosis not present

## 2022-12-04 DIAGNOSIS — R635 Abnormal weight gain: Secondary | ICD-10-CM | POA: Diagnosis not present

## 2022-12-04 DIAGNOSIS — I10 Essential (primary) hypertension: Secondary | ICD-10-CM | POA: Diagnosis not present

## 2022-12-04 DIAGNOSIS — Z9181 History of falling: Secondary | ICD-10-CM | POA: Diagnosis not present

## 2022-12-04 DIAGNOSIS — G4700 Insomnia, unspecified: Secondary | ICD-10-CM | POA: Diagnosis not present

## 2022-12-04 DIAGNOSIS — E785 Hyperlipidemia, unspecified: Secondary | ICD-10-CM | POA: Diagnosis not present

## 2022-12-05 DIAGNOSIS — M545 Low back pain, unspecified: Secondary | ICD-10-CM | POA: Diagnosis not present

## 2022-12-10 DIAGNOSIS — M545 Low back pain, unspecified: Secondary | ICD-10-CM | POA: Diagnosis not present

## 2022-12-12 DIAGNOSIS — M545 Low back pain, unspecified: Secondary | ICD-10-CM | POA: Diagnosis not present

## 2022-12-18 DIAGNOSIS — M545 Low back pain, unspecified: Secondary | ICD-10-CM | POA: Diagnosis not present

## 2023-01-08 DIAGNOSIS — Z9181 History of falling: Secondary | ICD-10-CM | POA: Diagnosis not present

## 2023-01-08 DIAGNOSIS — Z0000 Encounter for general adult medical examination without abnormal findings: Secondary | ICD-10-CM | POA: Diagnosis not present

## 2023-01-11 DIAGNOSIS — M4716 Other spondylosis with myelopathy, lumbar region: Secondary | ICD-10-CM | POA: Diagnosis not present

## 2023-01-11 DIAGNOSIS — M4326 Fusion of spine, lumbar region: Secondary | ICD-10-CM | POA: Diagnosis not present

## 2023-01-22 DIAGNOSIS — M25562 Pain in left knee: Secondary | ICD-10-CM | POA: Diagnosis not present

## 2023-01-22 DIAGNOSIS — M1712 Unilateral primary osteoarthritis, left knee: Secondary | ICD-10-CM | POA: Diagnosis not present

## 2023-01-22 DIAGNOSIS — G8929 Other chronic pain: Secondary | ICD-10-CM | POA: Diagnosis not present

## 2023-01-22 DIAGNOSIS — M25462 Effusion, left knee: Secondary | ICD-10-CM | POA: Diagnosis not present

## 2023-01-31 DIAGNOSIS — M1990 Unspecified osteoarthritis, unspecified site: Secondary | ICD-10-CM | POA: Diagnosis not present

## 2023-01-31 DIAGNOSIS — Z01810 Encounter for preprocedural cardiovascular examination: Secondary | ICD-10-CM | POA: Diagnosis not present

## 2023-01-31 DIAGNOSIS — Z6831 Body mass index (BMI) 31.0-31.9, adult: Secondary | ICD-10-CM | POA: Diagnosis not present

## 2023-01-31 DIAGNOSIS — E785 Hyperlipidemia, unspecified: Secondary | ICD-10-CM | POA: Diagnosis not present

## 2023-01-31 DIAGNOSIS — I10 Essential (primary) hypertension: Secondary | ICD-10-CM | POA: Diagnosis not present

## 2023-02-13 DIAGNOSIS — N958 Other specified menopausal and perimenopausal disorders: Secondary | ICD-10-CM | POA: Diagnosis not present

## 2023-02-13 DIAGNOSIS — M8588 Other specified disorders of bone density and structure, other site: Secondary | ICD-10-CM | POA: Diagnosis not present

## 2023-02-15 DIAGNOSIS — Z7409 Other reduced mobility: Secondary | ICD-10-CM | POA: Diagnosis not present

## 2023-02-15 DIAGNOSIS — G8929 Other chronic pain: Secondary | ICD-10-CM | POA: Diagnosis not present

## 2023-02-15 DIAGNOSIS — Z789 Other specified health status: Secondary | ICD-10-CM | POA: Diagnosis not present

## 2023-02-15 DIAGNOSIS — M25562 Pain in left knee: Secondary | ICD-10-CM | POA: Diagnosis not present

## 2023-02-25 DIAGNOSIS — G8918 Other acute postprocedural pain: Secondary | ICD-10-CM | POA: Diagnosis not present

## 2023-02-25 DIAGNOSIS — M1712 Unilateral primary osteoarthritis, left knee: Secondary | ICD-10-CM | POA: Diagnosis not present

## 2023-02-25 DIAGNOSIS — M25762 Osteophyte, left knee: Secondary | ICD-10-CM | POA: Diagnosis not present

## 2023-03-07 DIAGNOSIS — R2689 Other abnormalities of gait and mobility: Secondary | ICD-10-CM | POA: Diagnosis not present

## 2023-03-07 DIAGNOSIS — M6281 Muscle weakness (generalized): Secondary | ICD-10-CM | POA: Diagnosis not present

## 2023-03-07 DIAGNOSIS — M25562 Pain in left knee: Secondary | ICD-10-CM | POA: Diagnosis not present

## 2023-03-07 DIAGNOSIS — Z96652 Presence of left artificial knee joint: Secondary | ICD-10-CM | POA: Diagnosis not present

## 2023-03-07 DIAGNOSIS — Z471 Aftercare following joint replacement surgery: Secondary | ICD-10-CM | POA: Diagnosis not present

## 2023-03-07 DIAGNOSIS — M25462 Effusion, left knee: Secondary | ICD-10-CM | POA: Diagnosis not present

## 2023-03-12 DIAGNOSIS — M25562 Pain in left knee: Secondary | ICD-10-CM | POA: Diagnosis not present

## 2023-03-12 DIAGNOSIS — R2689 Other abnormalities of gait and mobility: Secondary | ICD-10-CM | POA: Diagnosis not present

## 2023-03-12 DIAGNOSIS — M6281 Muscle weakness (generalized): Secondary | ICD-10-CM | POA: Diagnosis not present

## 2023-03-14 DIAGNOSIS — M25562 Pain in left knee: Secondary | ICD-10-CM | POA: Diagnosis not present

## 2023-03-14 DIAGNOSIS — M6281 Muscle weakness (generalized): Secondary | ICD-10-CM | POA: Diagnosis not present

## 2023-03-14 DIAGNOSIS — R2689 Other abnormalities of gait and mobility: Secondary | ICD-10-CM | POA: Diagnosis not present

## 2023-03-18 DIAGNOSIS — R059 Cough, unspecified: Secondary | ICD-10-CM | POA: Diagnosis not present

## 2023-03-18 DIAGNOSIS — J019 Acute sinusitis, unspecified: Secondary | ICD-10-CM | POA: Diagnosis not present

## 2023-03-26 DIAGNOSIS — R2689 Other abnormalities of gait and mobility: Secondary | ICD-10-CM | POA: Diagnosis not present

## 2023-03-26 DIAGNOSIS — M25562 Pain in left knee: Secondary | ICD-10-CM | POA: Diagnosis not present

## 2023-03-26 DIAGNOSIS — M6281 Muscle weakness (generalized): Secondary | ICD-10-CM | POA: Diagnosis not present

## 2023-03-28 DIAGNOSIS — M25562 Pain in left knee: Secondary | ICD-10-CM | POA: Diagnosis not present

## 2023-03-28 DIAGNOSIS — M6281 Muscle weakness (generalized): Secondary | ICD-10-CM | POA: Diagnosis not present

## 2023-03-28 DIAGNOSIS — R2689 Other abnormalities of gait and mobility: Secondary | ICD-10-CM | POA: Diagnosis not present

## 2023-04-01 DIAGNOSIS — R2689 Other abnormalities of gait and mobility: Secondary | ICD-10-CM | POA: Diagnosis not present

## 2023-04-01 DIAGNOSIS — M6281 Muscle weakness (generalized): Secondary | ICD-10-CM | POA: Diagnosis not present

## 2023-04-01 DIAGNOSIS — M25562 Pain in left knee: Secondary | ICD-10-CM | POA: Diagnosis not present

## 2023-04-04 DIAGNOSIS — Z96652 Presence of left artificial knee joint: Secondary | ICD-10-CM | POA: Diagnosis not present

## 2023-04-05 DIAGNOSIS — M25562 Pain in left knee: Secondary | ICD-10-CM | POA: Diagnosis not present

## 2023-04-05 DIAGNOSIS — R2689 Other abnormalities of gait and mobility: Secondary | ICD-10-CM | POA: Diagnosis not present

## 2023-04-05 DIAGNOSIS — M6281 Muscle weakness (generalized): Secondary | ICD-10-CM | POA: Diagnosis not present

## 2023-04-08 DIAGNOSIS — M25562 Pain in left knee: Secondary | ICD-10-CM | POA: Diagnosis not present

## 2023-04-08 DIAGNOSIS — R2689 Other abnormalities of gait and mobility: Secondary | ICD-10-CM | POA: Diagnosis not present

## 2023-04-08 DIAGNOSIS — R7309 Other abnormal glucose: Secondary | ICD-10-CM | POA: Diagnosis not present

## 2023-04-08 DIAGNOSIS — Z6831 Body mass index (BMI) 31.0-31.9, adult: Secondary | ICD-10-CM | POA: Diagnosis not present

## 2023-04-08 DIAGNOSIS — M1990 Unspecified osteoarthritis, unspecified site: Secondary | ICD-10-CM | POA: Diagnosis not present

## 2023-04-08 DIAGNOSIS — G4700 Insomnia, unspecified: Secondary | ICD-10-CM | POA: Diagnosis not present

## 2023-04-08 DIAGNOSIS — R1013 Epigastric pain: Secondary | ICD-10-CM | POA: Diagnosis not present

## 2023-04-08 DIAGNOSIS — M6281 Muscle weakness (generalized): Secondary | ICD-10-CM | POA: Diagnosis not present

## 2023-04-08 DIAGNOSIS — I10 Essential (primary) hypertension: Secondary | ICD-10-CM | POA: Diagnosis not present

## 2023-04-08 DIAGNOSIS — E785 Hyperlipidemia, unspecified: Secondary | ICD-10-CM | POA: Diagnosis not present

## 2023-04-08 DIAGNOSIS — G2581 Restless legs syndrome: Secondary | ICD-10-CM | POA: Diagnosis not present

## 2023-04-08 DIAGNOSIS — Z23 Encounter for immunization: Secondary | ICD-10-CM | POA: Diagnosis not present

## 2023-04-10 DIAGNOSIS — M25562 Pain in left knee: Secondary | ICD-10-CM | POA: Diagnosis not present

## 2023-04-10 DIAGNOSIS — R2689 Other abnormalities of gait and mobility: Secondary | ICD-10-CM | POA: Diagnosis not present

## 2023-04-10 DIAGNOSIS — M6281 Muscle weakness (generalized): Secondary | ICD-10-CM | POA: Diagnosis not present

## 2023-04-15 DIAGNOSIS — M6281 Muscle weakness (generalized): Secondary | ICD-10-CM | POA: Diagnosis not present

## 2023-04-15 DIAGNOSIS — R2689 Other abnormalities of gait and mobility: Secondary | ICD-10-CM | POA: Diagnosis not present

## 2023-04-15 DIAGNOSIS — M25562 Pain in left knee: Secondary | ICD-10-CM | POA: Diagnosis not present

## 2023-04-18 DIAGNOSIS — M25562 Pain in left knee: Secondary | ICD-10-CM | POA: Diagnosis not present

## 2023-04-18 DIAGNOSIS — M6281 Muscle weakness (generalized): Secondary | ICD-10-CM | POA: Diagnosis not present

## 2023-04-18 DIAGNOSIS — R2689 Other abnormalities of gait and mobility: Secondary | ICD-10-CM | POA: Diagnosis not present

## 2023-05-17 DIAGNOSIS — M48062 Spinal stenosis, lumbar region with neurogenic claudication: Secondary | ICD-10-CM | POA: Diagnosis not present

## 2023-05-17 DIAGNOSIS — M4716 Other spondylosis with myelopathy, lumbar region: Secondary | ICD-10-CM | POA: Diagnosis not present

## 2023-06-06 DIAGNOSIS — Z96652 Presence of left artificial knee joint: Secondary | ICD-10-CM | POA: Diagnosis not present

## 2023-07-16 DIAGNOSIS — M4716 Other spondylosis with myelopathy, lumbar region: Secondary | ICD-10-CM | POA: Diagnosis not present

## 2023-08-06 DIAGNOSIS — Z6831 Body mass index (BMI) 31.0-31.9, adult: Secondary | ICD-10-CM | POA: Diagnosis not present

## 2023-08-06 DIAGNOSIS — I10 Essential (primary) hypertension: Secondary | ICD-10-CM | POA: Diagnosis not present

## 2023-08-06 DIAGNOSIS — G2581 Restless legs syndrome: Secondary | ICD-10-CM | POA: Diagnosis not present

## 2023-08-06 DIAGNOSIS — R7309 Other abnormal glucose: Secondary | ICD-10-CM | POA: Diagnosis not present

## 2023-08-06 DIAGNOSIS — M1990 Unspecified osteoarthritis, unspecified site: Secondary | ICD-10-CM | POA: Diagnosis not present

## 2023-08-06 DIAGNOSIS — E785 Hyperlipidemia, unspecified: Secondary | ICD-10-CM | POA: Diagnosis not present

## 2023-08-06 DIAGNOSIS — G4700 Insomnia, unspecified: Secondary | ICD-10-CM | POA: Diagnosis not present

## 2023-08-06 DIAGNOSIS — I700 Atherosclerosis of aorta: Secondary | ICD-10-CM | POA: Diagnosis not present

## 2023-08-06 DIAGNOSIS — R1084 Generalized abdominal pain: Secondary | ICD-10-CM | POA: Diagnosis not present

## 2023-08-06 DIAGNOSIS — R7303 Prediabetes: Secondary | ICD-10-CM | POA: Diagnosis not present

## 2023-08-12 DIAGNOSIS — R1084 Generalized abdominal pain: Secondary | ICD-10-CM | POA: Diagnosis not present

## 2023-08-12 DIAGNOSIS — K8020 Calculus of gallbladder without cholecystitis without obstruction: Secondary | ICD-10-CM | POA: Diagnosis not present

## 2023-08-12 DIAGNOSIS — K760 Fatty (change of) liver, not elsewhere classified: Secondary | ICD-10-CM | POA: Diagnosis not present

## 2023-08-21 DIAGNOSIS — M545 Low back pain, unspecified: Secondary | ICD-10-CM | POA: Diagnosis not present

## 2023-08-21 DIAGNOSIS — M6281 Muscle weakness (generalized): Secondary | ICD-10-CM | POA: Diagnosis not present

## 2023-08-23 DIAGNOSIS — M545 Low back pain, unspecified: Secondary | ICD-10-CM | POA: Diagnosis not present

## 2023-08-23 DIAGNOSIS — M6281 Muscle weakness (generalized): Secondary | ICD-10-CM | POA: Diagnosis not present

## 2023-08-26 DIAGNOSIS — M6281 Muscle weakness (generalized): Secondary | ICD-10-CM | POA: Diagnosis not present

## 2023-08-26 DIAGNOSIS — M545 Low back pain, unspecified: Secondary | ICD-10-CM | POA: Diagnosis not present

## 2023-08-28 DIAGNOSIS — M6281 Muscle weakness (generalized): Secondary | ICD-10-CM | POA: Diagnosis not present

## 2023-08-28 DIAGNOSIS — M545 Low back pain, unspecified: Secondary | ICD-10-CM | POA: Diagnosis not present

## 2023-09-02 DIAGNOSIS — K8020 Calculus of gallbladder without cholecystitis without obstruction: Secondary | ICD-10-CM | POA: Diagnosis not present

## 2023-09-03 DIAGNOSIS — M545 Low back pain, unspecified: Secondary | ICD-10-CM | POA: Diagnosis not present

## 2023-09-03 DIAGNOSIS — M6281 Muscle weakness (generalized): Secondary | ICD-10-CM | POA: Diagnosis not present

## 2023-09-05 DIAGNOSIS — M545 Low back pain, unspecified: Secondary | ICD-10-CM | POA: Diagnosis not present

## 2023-09-05 DIAGNOSIS — M6281 Muscle weakness (generalized): Secondary | ICD-10-CM | POA: Diagnosis not present

## 2023-09-06 DIAGNOSIS — K8020 Calculus of gallbladder without cholecystitis without obstruction: Secondary | ICD-10-CM | POA: Diagnosis not present

## 2023-09-06 DIAGNOSIS — R1084 Generalized abdominal pain: Secondary | ICD-10-CM | POA: Diagnosis not present

## 2023-09-11 DIAGNOSIS — M545 Low back pain, unspecified: Secondary | ICD-10-CM | POA: Diagnosis not present

## 2023-09-11 DIAGNOSIS — M6281 Muscle weakness (generalized): Secondary | ICD-10-CM | POA: Diagnosis not present

## 2023-09-13 DIAGNOSIS — M545 Low back pain, unspecified: Secondary | ICD-10-CM | POA: Diagnosis not present

## 2023-09-13 DIAGNOSIS — M6281 Muscle weakness (generalized): Secondary | ICD-10-CM | POA: Diagnosis not present

## 2023-09-18 DIAGNOSIS — M6281 Muscle weakness (generalized): Secondary | ICD-10-CM | POA: Diagnosis not present

## 2023-09-18 DIAGNOSIS — M545 Low back pain, unspecified: Secondary | ICD-10-CM | POA: Diagnosis not present

## 2023-10-02 DIAGNOSIS — M545 Low back pain, unspecified: Secondary | ICD-10-CM | POA: Diagnosis not present

## 2023-10-02 DIAGNOSIS — M6281 Muscle weakness (generalized): Secondary | ICD-10-CM | POA: Diagnosis not present

## 2023-10-04 DIAGNOSIS — K8010 Calculus of gallbladder with chronic cholecystitis without obstruction: Secondary | ICD-10-CM | POA: Diagnosis not present

## 2023-10-14 DIAGNOSIS — G2581 Restless legs syndrome: Secondary | ICD-10-CM | POA: Diagnosis not present

## 2023-10-14 DIAGNOSIS — K8010 Calculus of gallbladder with chronic cholecystitis without obstruction: Secondary | ICD-10-CM | POA: Diagnosis not present

## 2023-10-14 DIAGNOSIS — M51369 Other intervertebral disc degeneration, lumbar region without mention of lumbar back pain or lower extremity pain: Secondary | ICD-10-CM | POA: Diagnosis not present

## 2023-10-14 DIAGNOSIS — M810 Age-related osteoporosis without current pathological fracture: Secondary | ICD-10-CM | POA: Diagnosis not present

## 2023-10-14 DIAGNOSIS — I10 Essential (primary) hypertension: Secondary | ICD-10-CM | POA: Diagnosis not present

## 2023-10-14 DIAGNOSIS — K219 Gastro-esophageal reflux disease without esophagitis: Secondary | ICD-10-CM | POA: Diagnosis not present

## 2023-10-14 DIAGNOSIS — M1990 Unspecified osteoarthritis, unspecified site: Secondary | ICD-10-CM | POA: Diagnosis not present

## 2023-10-14 DIAGNOSIS — Z79899 Other long term (current) drug therapy: Secondary | ICD-10-CM | POA: Diagnosis not present

## 2023-10-14 DIAGNOSIS — Z7982 Long term (current) use of aspirin: Secondary | ICD-10-CM | POA: Diagnosis not present

## 2023-10-14 DIAGNOSIS — E785 Hyperlipidemia, unspecified: Secondary | ICD-10-CM | POA: Diagnosis not present

## 2023-11-19 DIAGNOSIS — Z1330 Encounter for screening examination for mental health and behavioral disorders, unspecified: Secondary | ICD-10-CM | POA: Diagnosis not present

## 2023-11-19 DIAGNOSIS — M546 Pain in thoracic spine: Secondary | ICD-10-CM | POA: Diagnosis not present

## 2023-11-19 DIAGNOSIS — M4804 Spinal stenosis, thoracic region: Secondary | ICD-10-CM | POA: Diagnosis not present

## 2023-11-19 DIAGNOSIS — G8929 Other chronic pain: Secondary | ICD-10-CM | POA: Diagnosis not present

## 2023-11-19 DIAGNOSIS — M4014 Other secondary kyphosis, thoracic region: Secondary | ICD-10-CM | POA: Diagnosis not present

## 2023-11-20 ENCOUNTER — Other Ambulatory Visit: Payer: Self-pay | Admitting: Physician Assistant

## 2023-11-20 DIAGNOSIS — M4014 Other secondary kyphosis, thoracic region: Secondary | ICD-10-CM

## 2023-11-20 DIAGNOSIS — M4804 Spinal stenosis, thoracic region: Secondary | ICD-10-CM

## 2023-12-05 ENCOUNTER — Ambulatory Visit
Admission: RE | Admit: 2023-12-05 | Discharge: 2023-12-05 | Disposition: A | Discharge status: Home / Self Care | Source: Ambulatory Visit | Attending: Physician Assistant

## 2023-12-05 DIAGNOSIS — M5134 Other intervertebral disc degeneration, thoracic region: Secondary | ICD-10-CM | POA: Diagnosis not present

## 2023-12-05 DIAGNOSIS — M4014 Other secondary kyphosis, thoracic region: Secondary | ICD-10-CM

## 2023-12-05 DIAGNOSIS — M4804 Spinal stenosis, thoracic region: Secondary | ICD-10-CM

## 2023-12-05 DIAGNOSIS — R600 Localized edema: Secondary | ICD-10-CM | POA: Diagnosis not present

## 2023-12-10 DIAGNOSIS — E785 Hyperlipidemia, unspecified: Secondary | ICD-10-CM | POA: Diagnosis not present

## 2023-12-10 DIAGNOSIS — Z6831 Body mass index (BMI) 31.0-31.9, adult: Secondary | ICD-10-CM | POA: Diagnosis not present

## 2023-12-10 DIAGNOSIS — I10 Essential (primary) hypertension: Secondary | ICD-10-CM | POA: Diagnosis not present

## 2023-12-10 DIAGNOSIS — G4700 Insomnia, unspecified: Secondary | ICD-10-CM | POA: Diagnosis not present

## 2023-12-10 DIAGNOSIS — K219 Gastro-esophageal reflux disease without esophagitis: Secondary | ICD-10-CM | POA: Diagnosis not present

## 2023-12-10 DIAGNOSIS — I700 Atherosclerosis of aorta: Secondary | ICD-10-CM | POA: Diagnosis not present

## 2023-12-10 DIAGNOSIS — G2581 Restless legs syndrome: Secondary | ICD-10-CM | POA: Diagnosis not present

## 2023-12-10 DIAGNOSIS — M1990 Unspecified osteoarthritis, unspecified site: Secondary | ICD-10-CM | POA: Diagnosis not present

## 2023-12-10 DIAGNOSIS — R7303 Prediabetes: Secondary | ICD-10-CM | POA: Diagnosis not present

## 2023-12-13 ENCOUNTER — Other Ambulatory Visit: Payer: Self-pay | Admitting: Physician Assistant

## 2023-12-13 DIAGNOSIS — M4644 Discitis, unspecified, thoracic region: Secondary | ICD-10-CM

## 2023-12-17 ENCOUNTER — Ambulatory Visit
Admission: RE | Admit: 2023-12-17 | Discharge: 2023-12-17 | Disposition: A | Discharge status: Home / Self Care | Source: Ambulatory Visit | Attending: Physician Assistant

## 2023-12-17 ENCOUNTER — Ambulatory Visit
Admission: RE | Admit: 2023-12-17 | Discharge: 2023-12-17 | Disposition: A | Discharge status: Home / Self Care | Source: Ambulatory Visit | Attending: Physician Assistant | Admitting: Physician Assistant

## 2023-12-17 DIAGNOSIS — M4644 Discitis, unspecified, thoracic region: Secondary | ICD-10-CM

## 2023-12-18 ENCOUNTER — Other Ambulatory Visit: Payer: Self-pay | Admitting: Physician Assistant

## 2023-12-18 DIAGNOSIS — G061 Intraspinal abscess and granuloma: Secondary | ICD-10-CM

## 2023-12-18 DIAGNOSIS — M4804 Spinal stenosis, thoracic region: Secondary | ICD-10-CM

## 2023-12-18 DIAGNOSIS — M4644 Discitis, unspecified, thoracic region: Secondary | ICD-10-CM | POA: Diagnosis not present

## 2023-12-18 DIAGNOSIS — T84296A Other mechanical complication of internal fixation device of vertebrae, initial encounter: Secondary | ICD-10-CM | POA: Diagnosis not present

## 2023-12-18 DIAGNOSIS — M960 Pseudarthrosis after fusion or arthrodesis: Secondary | ICD-10-CM | POA: Diagnosis not present

## 2023-12-18 DIAGNOSIS — M5104 Intervertebral disc disorders with myelopathy, thoracic region: Secondary | ICD-10-CM | POA: Diagnosis not present

## 2024-01-08 DIAGNOSIS — Z6831 Body mass index (BMI) 31.0-31.9, adult: Secondary | ICD-10-CM | POA: Diagnosis not present

## 2024-01-08 DIAGNOSIS — Z1231 Encounter for screening mammogram for malignant neoplasm of breast: Secondary | ICD-10-CM | POA: Diagnosis not present

## 2024-01-08 DIAGNOSIS — Z01419 Encounter for gynecological examination (general) (routine) without abnormal findings: Secondary | ICD-10-CM | POA: Diagnosis not present

## 2024-01-14 DIAGNOSIS — Z0000 Encounter for general adult medical examination without abnormal findings: Secondary | ICD-10-CM | POA: Diagnosis not present

## 2024-01-14 DIAGNOSIS — Z9181 History of falling: Secondary | ICD-10-CM | POA: Diagnosis not present

## 2024-01-17 ENCOUNTER — Ambulatory Visit
Admission: RE | Admit: 2024-01-17 | Discharge: 2024-01-17 | Disposition: A | Discharge status: Home / Self Care | Source: Ambulatory Visit | Attending: Physician Assistant | Admitting: Physician Assistant

## 2024-01-17 DIAGNOSIS — M47814 Spondylosis without myelopathy or radiculopathy, thoracic region: Secondary | ICD-10-CM | POA: Diagnosis not present

## 2024-01-17 DIAGNOSIS — G061 Intraspinal abscess and granuloma: Secondary | ICD-10-CM

## 2024-01-17 DIAGNOSIS — M4644 Discitis, unspecified, thoracic region: Secondary | ICD-10-CM

## 2024-01-17 DIAGNOSIS — M4804 Spinal stenosis, thoracic region: Secondary | ICD-10-CM

## 2024-01-17 DIAGNOSIS — M5124 Other intervertebral disc displacement, thoracic region: Secondary | ICD-10-CM | POA: Diagnosis not present

## 2024-01-17 MED ORDER — GADOPICLENOL 0.5 MMOL/ML IV SOLN
7.0000 mL | Freq: Once | INTRAVENOUS | Status: AC | PRN
  Administered 2024-01-17: 7 mL via INTRAVENOUS

## 2024-01-22 DIAGNOSIS — M960 Pseudarthrosis after fusion or arthrodesis: Secondary | ICD-10-CM | POA: Diagnosis not present

## 2024-01-22 DIAGNOSIS — M5104 Intervertebral disc disorders with myelopathy, thoracic region: Secondary | ICD-10-CM | POA: Diagnosis not present

## 2024-01-22 DIAGNOSIS — G061 Intraspinal abscess and granuloma: Secondary | ICD-10-CM | POA: Diagnosis not present

## 2024-01-22 DIAGNOSIS — M4644 Discitis, unspecified, thoracic region: Secondary | ICD-10-CM | POA: Diagnosis not present

## 2024-01-22 DIAGNOSIS — M4804 Spinal stenosis, thoracic region: Secondary | ICD-10-CM | POA: Diagnosis not present

## 2024-02-07 DIAGNOSIS — M4804 Spinal stenosis, thoracic region: Secondary | ICD-10-CM | POA: Diagnosis not present

## 2024-02-07 DIAGNOSIS — Z01818 Encounter for other preprocedural examination: Secondary | ICD-10-CM | POA: Diagnosis not present

## 2024-02-09 DIAGNOSIS — Z01810 Encounter for preprocedural cardiovascular examination: Secondary | ICD-10-CM | POA: Diagnosis not present

## 2024-02-17 DIAGNOSIS — M5114 Intervertebral disc disorders with radiculopathy, thoracic region: Secondary | ICD-10-CM | POA: Diagnosis not present

## 2024-02-17 DIAGNOSIS — Z981 Arthrodesis status: Secondary | ICD-10-CM | POA: Diagnosis not present

## 2024-02-17 DIAGNOSIS — M48062 Spinal stenosis, lumbar region with neurogenic claudication: Secondary | ICD-10-CM | POA: Diagnosis not present

## 2024-02-17 DIAGNOSIS — M4715 Other spondylosis with myelopathy, thoracolumbar region: Secondary | ICD-10-CM | POA: Diagnosis not present

## 2024-02-17 DIAGNOSIS — M810 Age-related osteoporosis without current pathological fracture: Secondary | ICD-10-CM | POA: Diagnosis not present

## 2024-02-17 DIAGNOSIS — M4725 Other spondylosis with radiculopathy, thoracolumbar region: Secondary | ICD-10-CM | POA: Diagnosis not present

## 2024-02-17 DIAGNOSIS — M4804 Spinal stenosis, thoracic region: Secondary | ICD-10-CM | POA: Diagnosis not present

## 2024-02-17 DIAGNOSIS — E785 Hyperlipidemia, unspecified: Secondary | ICD-10-CM | POA: Diagnosis not present

## 2024-02-17 DIAGNOSIS — G2581 Restless legs syndrome: Secondary | ICD-10-CM | POA: Diagnosis not present

## 2024-02-17 DIAGNOSIS — R269 Unspecified abnormalities of gait and mobility: Secondary | ICD-10-CM | POA: Diagnosis not present

## 2024-02-17 DIAGNOSIS — M5104 Intervertebral disc disorders with myelopathy, thoracic region: Secondary | ICD-10-CM | POA: Diagnosis not present

## 2024-02-17 DIAGNOSIS — M4714 Other spondylosis with myelopathy, thoracic region: Secondary | ICD-10-CM | POA: Diagnosis not present

## 2024-02-17 DIAGNOSIS — T84498A Other mechanical complication of other internal orthopedic devices, implants and grafts, initial encounter: Secondary | ICD-10-CM | POA: Diagnosis not present

## 2024-02-17 DIAGNOSIS — M960 Pseudarthrosis after fusion or arthrodesis: Secondary | ICD-10-CM | POA: Diagnosis not present

## 2024-02-17 DIAGNOSIS — T84226A Displacement of internal fixation device of vertebrae, initial encounter: Secondary | ICD-10-CM | POA: Diagnosis not present

## 2024-02-17 DIAGNOSIS — M4724 Other spondylosis with radiculopathy, thoracic region: Secondary | ICD-10-CM | POA: Diagnosis not present

## 2024-02-17 DIAGNOSIS — M48061 Spinal stenosis, lumbar region without neurogenic claudication: Secondary | ICD-10-CM | POA: Diagnosis not present

## 2024-02-17 DIAGNOSIS — I10 Essential (primary) hypertension: Secondary | ICD-10-CM | POA: Diagnosis not present

## 2024-03-17 DIAGNOSIS — M48062 Spinal stenosis, lumbar region with neurogenic claudication: Secondary | ICD-10-CM | POA: Diagnosis not present

## 2024-03-31 DIAGNOSIS — Z471 Aftercare following joint replacement surgery: Secondary | ICD-10-CM | POA: Diagnosis not present

## 2024-03-31 DIAGNOSIS — Z96652 Presence of left artificial knee joint: Secondary | ICD-10-CM | POA: Diagnosis not present

## 2024-04-21 DIAGNOSIS — R7303 Prediabetes: Secondary | ICD-10-CM | POA: Diagnosis not present

## 2024-04-21 DIAGNOSIS — G2581 Restless legs syndrome: Secondary | ICD-10-CM | POA: Diagnosis not present

## 2024-04-21 DIAGNOSIS — E785 Hyperlipidemia, unspecified: Secondary | ICD-10-CM | POA: Diagnosis not present

## 2024-04-21 DIAGNOSIS — I10 Essential (primary) hypertension: Secondary | ICD-10-CM | POA: Diagnosis not present
